# Patient Record
Sex: Female | Born: 1940 | Race: White | Hispanic: No | Smoking: Former smoker
Health system: Southern US, Community
[De-identification: ages and names within clinical notes are randomized; demographics above are authoritative.]

## PROBLEM LIST (undated history)

## (undated) DIAGNOSIS — K859 Acute pancreatitis without necrosis or infection, unspecified: Secondary | ICD-10-CM

## (undated) DIAGNOSIS — Z860100 Personal history of colon polyps, unspecified: Secondary | ICD-10-CM

## (undated) DIAGNOSIS — T7840XA Allergy, unspecified, initial encounter: Secondary | ICD-10-CM

## (undated) DIAGNOSIS — E669 Obesity, unspecified: Secondary | ICD-10-CM

## (undated) DIAGNOSIS — E785 Hyperlipidemia, unspecified: Secondary | ICD-10-CM

## (undated) DIAGNOSIS — K219 Gastro-esophageal reflux disease without esophagitis: Secondary | ICD-10-CM

## (undated) DIAGNOSIS — I10 Essential (primary) hypertension: Secondary | ICD-10-CM

## (undated) HISTORY — DX: Gastro-esophageal reflux disease without esophagitis: K21.9

## (undated) HISTORY — DX: Acute pancreatitis without necrosis or infection, unspecified: K85.90

## (undated) HISTORY — PX: TONSILLECTOMY: SHX5217

## (undated) HISTORY — DX: Hyperlipidemia, unspecified: E78.5

## (undated) HISTORY — DX: Obesity, unspecified: E66.9

## (undated) HISTORY — PX: MOHS SURGERY: SUR867

## (undated) HISTORY — DX: Allergy, unspecified, initial encounter: T78.40XA

## (undated) HISTORY — DX: Essential (primary) hypertension: I10

## (undated) HISTORY — DX: Personal history of colon polyps, unspecified: Z86.0100

## (undated) HISTORY — DX: Personal history of colonic polyps: Z86.010

---

## 1997-10-16 ENCOUNTER — Other Ambulatory Visit: Admission: RE | Admit: 1997-10-16 | Discharge: 1997-10-16 | Payer: Self-pay | Admitting: Obstetrics and Gynecology

## 1998-01-13 ENCOUNTER — Other Ambulatory Visit: Admission: RE | Admit: 1998-01-13 | Discharge: 1998-01-13 | Payer: Self-pay | Admitting: Obstetrics and Gynecology

## 1999-02-16 ENCOUNTER — Other Ambulatory Visit: Admission: RE | Admit: 1999-02-16 | Discharge: 1999-02-16 | Payer: Self-pay | Admitting: Obstetrics and Gynecology

## 2000-04-20 ENCOUNTER — Other Ambulatory Visit: Admission: RE | Admit: 2000-04-20 | Discharge: 2000-04-20 | Payer: Self-pay | Admitting: Obstetrics and Gynecology

## 2002-02-27 ENCOUNTER — Other Ambulatory Visit: Admission: RE | Admit: 2002-02-27 | Discharge: 2002-02-27 | Payer: Self-pay | Admitting: Obstetrics and Gynecology

## 2003-07-09 ENCOUNTER — Other Ambulatory Visit: Admission: RE | Admit: 2003-07-09 | Discharge: 2003-07-09 | Payer: Self-pay | Admitting: Obstetrics and Gynecology

## 2004-12-07 ENCOUNTER — Ambulatory Visit: Payer: Self-pay | Admitting: Internal Medicine

## 2004-12-20 ENCOUNTER — Ambulatory Visit: Payer: Self-pay | Admitting: Internal Medicine

## 2005-01-11 ENCOUNTER — Ambulatory Visit: Payer: Self-pay | Admitting: Family Medicine

## 2005-04-27 ENCOUNTER — Ambulatory Visit: Payer: Self-pay | Admitting: Family Medicine

## 2005-05-03 ENCOUNTER — Ambulatory Visit: Payer: Self-pay | Admitting: Family Medicine

## 2005-05-24 ENCOUNTER — Ambulatory Visit: Payer: Self-pay | Admitting: Family Medicine

## 2005-07-05 ENCOUNTER — Ambulatory Visit: Payer: Self-pay | Admitting: Internal Medicine

## 2005-11-17 ENCOUNTER — Ambulatory Visit: Payer: Self-pay | Admitting: Family Medicine

## 2006-06-15 ENCOUNTER — Ambulatory Visit: Payer: Self-pay | Admitting: Family Medicine

## 2006-12-25 DIAGNOSIS — Z860100 Personal history of colon polyps, unspecified: Secondary | ICD-10-CM | POA: Insufficient documentation

## 2006-12-25 DIAGNOSIS — K219 Gastro-esophageal reflux disease without esophagitis: Secondary | ICD-10-CM | POA: Insufficient documentation

## 2006-12-25 DIAGNOSIS — I10 Essential (primary) hypertension: Secondary | ICD-10-CM | POA: Insufficient documentation

## 2007-02-19 ENCOUNTER — Ambulatory Visit: Payer: Self-pay | Admitting: Family Medicine

## 2007-02-19 DIAGNOSIS — E669 Obesity, unspecified: Secondary | ICD-10-CM | POA: Insufficient documentation

## 2007-05-08 ENCOUNTER — Encounter: Payer: Self-pay | Admitting: Family Medicine

## 2007-05-25 ENCOUNTER — Ambulatory Visit: Payer: Self-pay | Admitting: Family Medicine

## 2007-07-19 ENCOUNTER — Ambulatory Visit: Payer: Self-pay | Admitting: Family Medicine

## 2007-07-19 DIAGNOSIS — J36 Peritonsillar abscess: Secondary | ICD-10-CM | POA: Insufficient documentation

## 2007-10-04 ENCOUNTER — Ambulatory Visit: Payer: Self-pay | Admitting: Family Medicine

## 2007-10-04 DIAGNOSIS — M7660 Achilles tendinitis, unspecified leg: Secondary | ICD-10-CM

## 2007-12-07 DIAGNOSIS — R1011 Right upper quadrant pain: Secondary | ICD-10-CM

## 2007-12-10 ENCOUNTER — Emergency Department (HOSPITAL_COMMUNITY): Admission: EM | Admit: 2007-12-10 | Discharge: 2007-12-11 | Payer: Self-pay | Admitting: Family Medicine

## 2007-12-20 ENCOUNTER — Ambulatory Visit: Payer: Self-pay | Admitting: Family Medicine

## 2007-12-26 ENCOUNTER — Ambulatory Visit (HOSPITAL_COMMUNITY): Admission: RE | Admit: 2007-12-26 | Discharge: 2007-12-26 | Payer: Self-pay | Admitting: Family Medicine

## 2007-12-27 ENCOUNTER — Telehealth (INDEPENDENT_AMBULATORY_CARE_PROVIDER_SITE_OTHER): Payer: Self-pay | Admitting: *Deleted

## 2007-12-27 ENCOUNTER — Telehealth: Payer: Self-pay | Admitting: Family Medicine

## 2007-12-28 ENCOUNTER — Telehealth: Payer: Self-pay | Admitting: Internal Medicine

## 2007-12-31 ENCOUNTER — Ambulatory Visit: Payer: Self-pay | Admitting: Internal Medicine

## 2007-12-31 DIAGNOSIS — R1013 Epigastric pain: Secondary | ICD-10-CM | POA: Insufficient documentation

## 2008-01-07 ENCOUNTER — Ambulatory Visit: Payer: Self-pay | Admitting: Internal Medicine

## 2008-01-07 LAB — CONVERTED CEMR LAB
Amylase: 204 units/L — ABNORMAL HIGH (ref 27–131)
BUN: 21 mg/dL (ref 6–23)
Creatinine, Ser: 1.1 mg/dL (ref 0.4–1.2)
Direct LDL: 153.2 mg/dL
Total CHOL/HDL Ratio: 4.7

## 2008-01-24 ENCOUNTER — Ambulatory Visit: Payer: Self-pay | Admitting: Internal Medicine

## 2008-01-29 ENCOUNTER — Encounter: Payer: Self-pay | Admitting: Family Medicine

## 2008-04-18 ENCOUNTER — Telehealth: Payer: Self-pay | Admitting: Family Medicine

## 2008-04-18 ENCOUNTER — Ambulatory Visit: Payer: Self-pay | Admitting: Family Medicine

## 2008-04-18 DIAGNOSIS — Z8719 Personal history of other diseases of the digestive system: Secondary | ICD-10-CM | POA: Insufficient documentation

## 2008-04-18 LAB — CONVERTED CEMR LAB
CO2: 29 meq/L (ref 19–32)
Direct LDL: 147.3 mg/dL
GFR calc Af Amer: 71 mL/min
Glucose, Bld: 84 mg/dL (ref 70–99)
Potassium: 4.4 meq/L (ref 3.5–5.1)
Sodium: 145 meq/L (ref 135–145)
TSH: 0.08 microintl units/mL — ABNORMAL LOW (ref 0.35–5.50)
Total CHOL/HDL Ratio: 3.9

## 2008-04-29 ENCOUNTER — Ambulatory Visit: Payer: Self-pay | Admitting: Family Medicine

## 2008-04-29 LAB — CONVERTED CEMR LAB
Free T4: 1.2 ng/dL (ref 0.6–1.6)
TSH: 0.07 microintl units/mL — ABNORMAL LOW (ref 0.35–5.50)

## 2008-05-06 ENCOUNTER — Telehealth: Payer: Self-pay | Admitting: Family Medicine

## 2008-06-10 ENCOUNTER — Telehealth: Payer: Self-pay | Admitting: Family Medicine

## 2008-06-10 ENCOUNTER — Ambulatory Visit: Payer: Self-pay | Admitting: Family Medicine

## 2008-06-11 ENCOUNTER — Telehealth: Payer: Self-pay | Admitting: Family Medicine

## 2008-06-12 LAB — CONVERTED CEMR LAB
AST: 28 units/L (ref 0–37)
Albumin: 3.9 g/dL (ref 3.5–5.2)
Alkaline Phosphatase: 72 units/L (ref 39–117)
BUN: 22 mg/dL (ref 6–23)
Bilirubin, Direct: 0.1 mg/dL (ref 0.0–0.3)
Chloride: 102 meq/L (ref 96–112)
Eosinophils Absolute: 0.1 10*3/uL (ref 0.0–0.7)
Eosinophils Relative: 1.1 % (ref 0.0–5.0)
GFR calc Af Amer: 64 mL/min
GFR calc non Af Amer: 52 mL/min
MCV: 92.1 fL (ref 78.0–100.0)
Neutrophils Relative %: 70.1 % (ref 43.0–77.0)
Platelets: 245 10*3/uL (ref 150–400)
Potassium: 3.9 meq/L (ref 3.5–5.1)
RDW: 12.3 % (ref 11.5–14.6)
Sodium: 140 meq/L (ref 135–145)
Total Bilirubin: 0.9 mg/dL (ref 0.3–1.2)
WBC: 9 10*3/uL (ref 4.5–10.5)

## 2008-06-13 ENCOUNTER — Telehealth: Payer: Self-pay | Admitting: Family Medicine

## 2008-07-03 ENCOUNTER — Ambulatory Visit: Payer: Self-pay | Admitting: Family Medicine

## 2008-07-03 DIAGNOSIS — J069 Acute upper respiratory infection, unspecified: Secondary | ICD-10-CM | POA: Insufficient documentation

## 2008-07-07 ENCOUNTER — Telehealth: Payer: Self-pay | Admitting: Family Medicine

## 2008-11-15 IMAGING — CT CT ABDOMEN WO/W CM
3 of 9 series · 13 of 46 positions shown, 19 images · IV contrast (Omnipaque 300)
Comparison: CT abdomen pelvis of 12/11/2007

CLINICAL DATA: Abdominal discomfort, evaluate for pancreatitis

CT ABDOMEN WITHOUT AND WITH CONTRAST
TECHNIQUE: Multidetector CT imaging of the abdomen was performed
following the standard protocol before and during bolus
administration of intravenous contrast.
Contrast: 125 ml Emnipaque-KFF

[Series 2: non_contrast 5.0 b30f st · axial · 0.79mm/px · z∈[-248,-128]mm · 4 of 50 slices shown]
[im 9/50  soft-tissue]
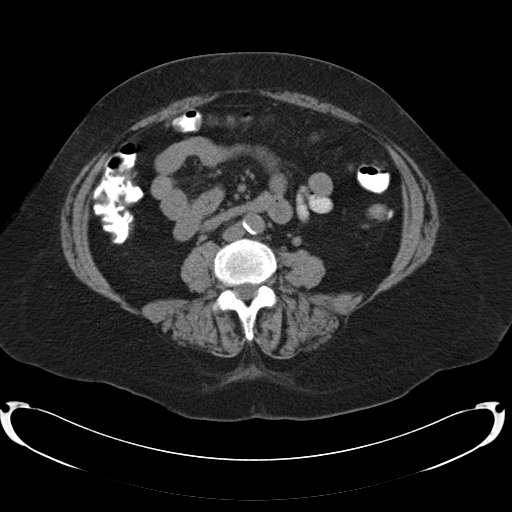
[im 17/50  soft-tissue]
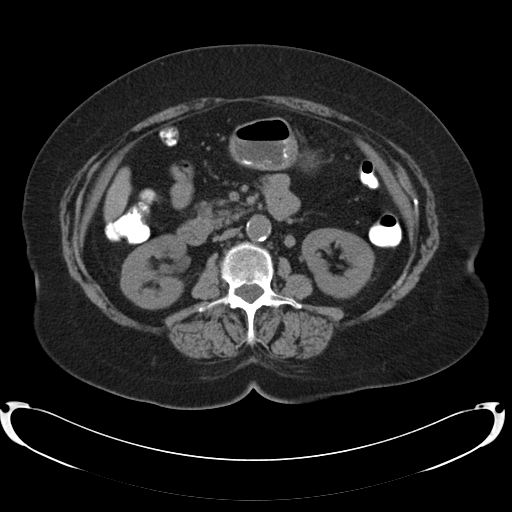
[im 25/50  soft-tissue]
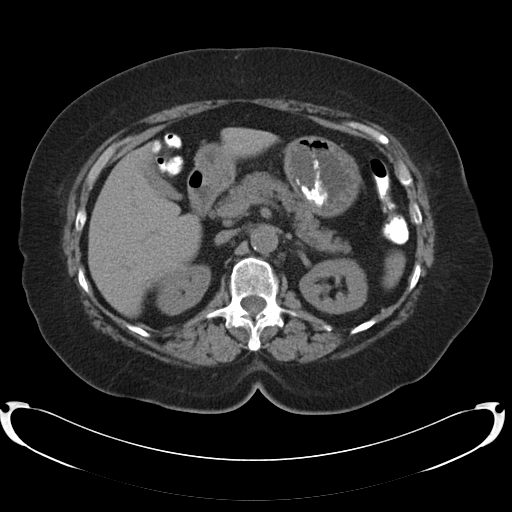
[im 33/50  soft-tissue]
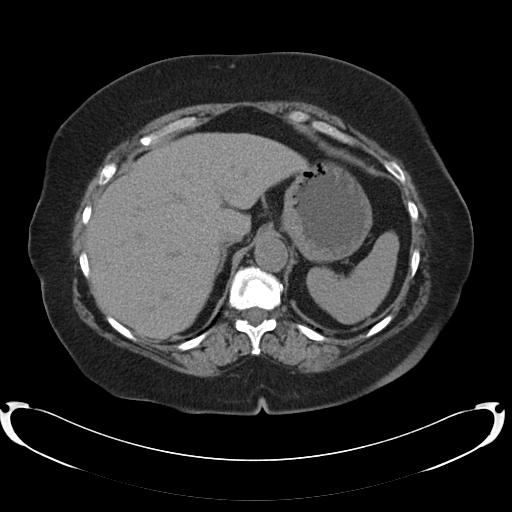

[Series 6: portal_venous 5.0 b30f · axial · 0.80mm/px · z∈[-255,-80]mm · 6 of 49 slices shown, 11 images]
[im 7/49  soft-tissue]
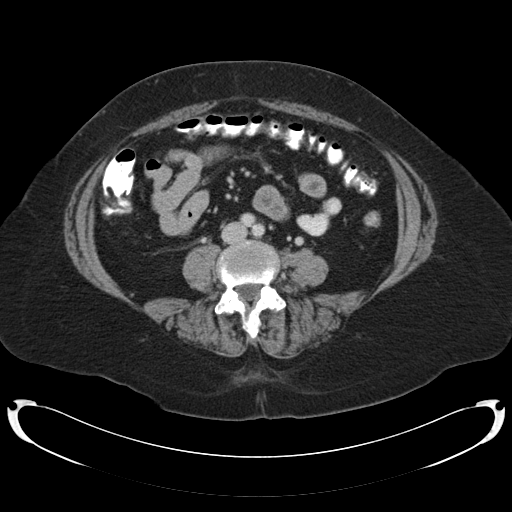
[im 7/49  bone]
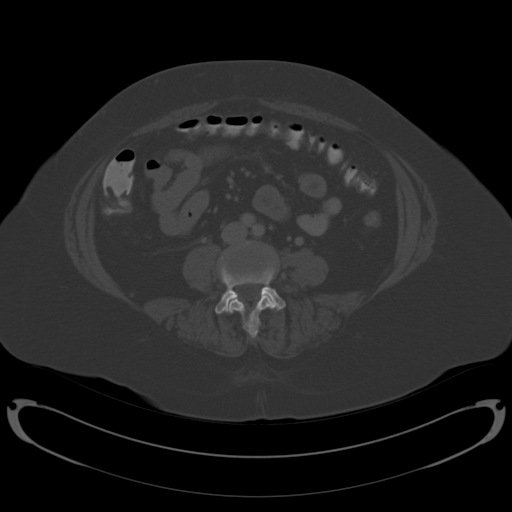
[im 14/49  soft-tissue]
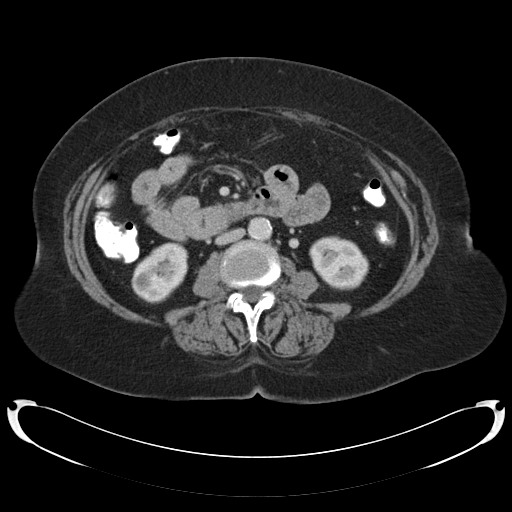
[im 21/49  soft-tissue]
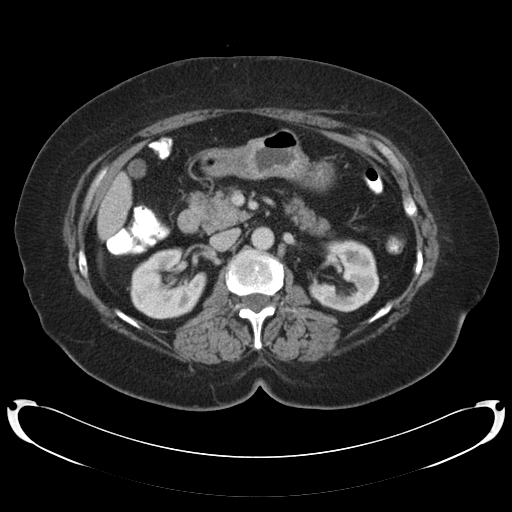
[im 21/49  lung]
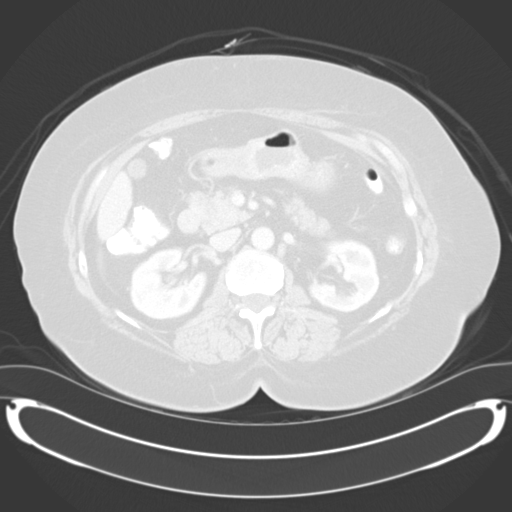
[im 28/49  soft-tissue]
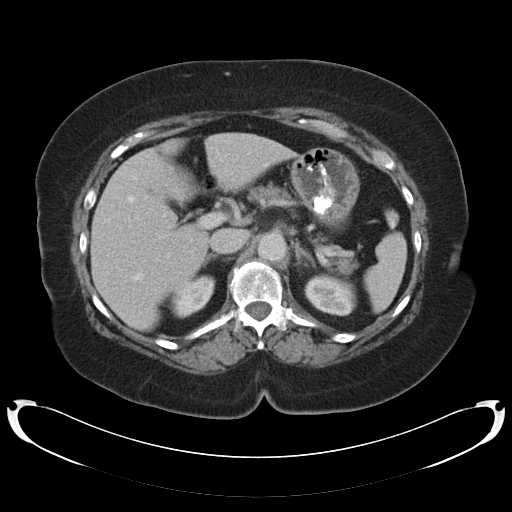
[im 28/49  lung]
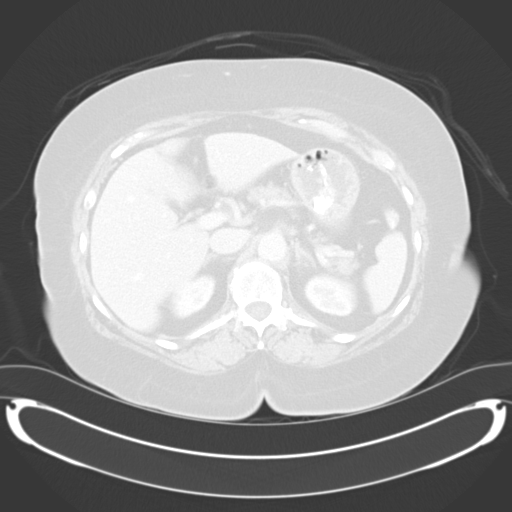
[im 35/49  soft-tissue]
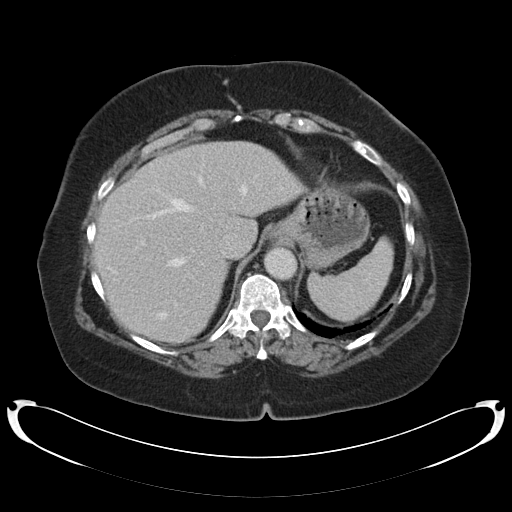
[im 35/49  lung]
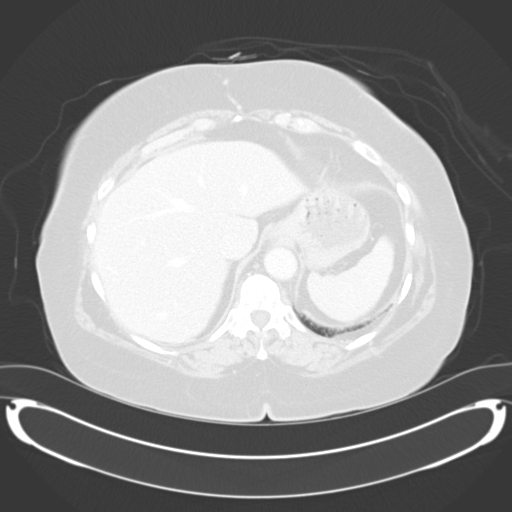
[im 42/49  soft-tissue]
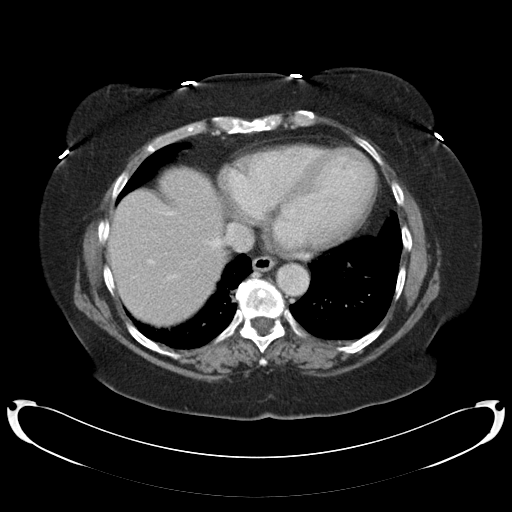
[im 42/49  lung]
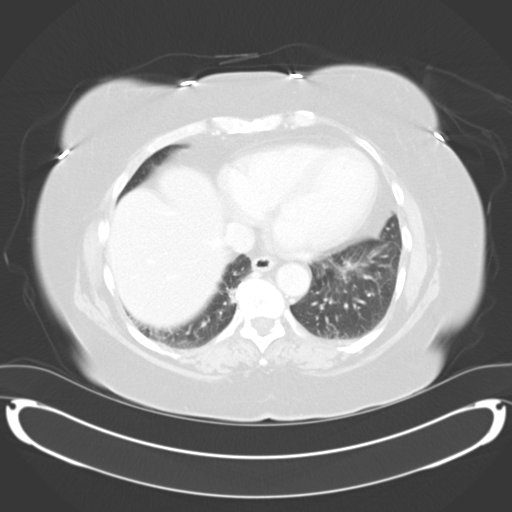

[Series 602: <mpr thick range> · coronal · 0.80mm/px · 3 of 83 slices shown, 4 images]
[im 21/83  soft-tissue]
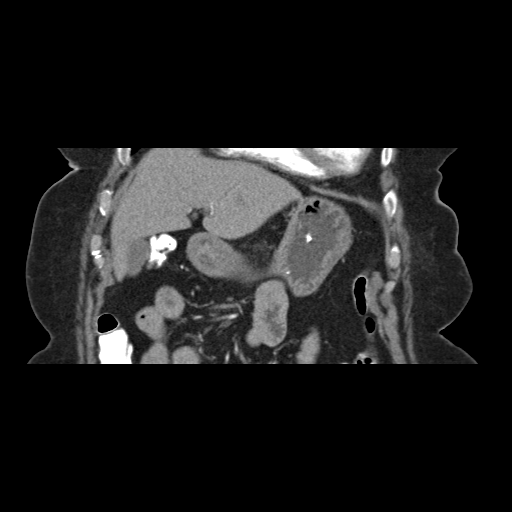
[im 42/83  soft-tissue]
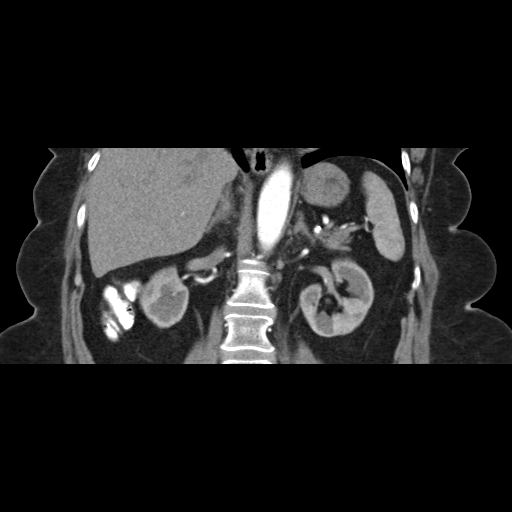
[im 42/83  bone]
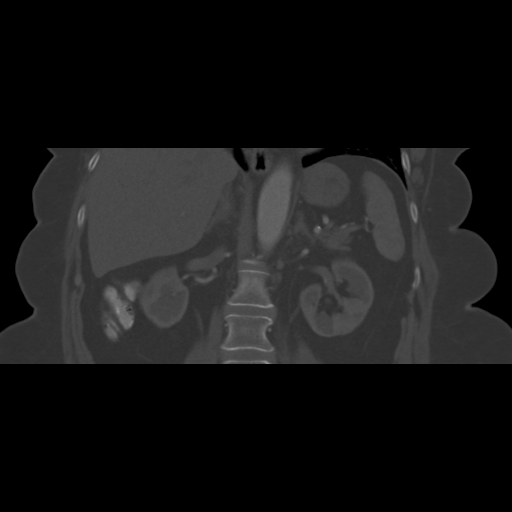
[im 62/83  soft-tissue]
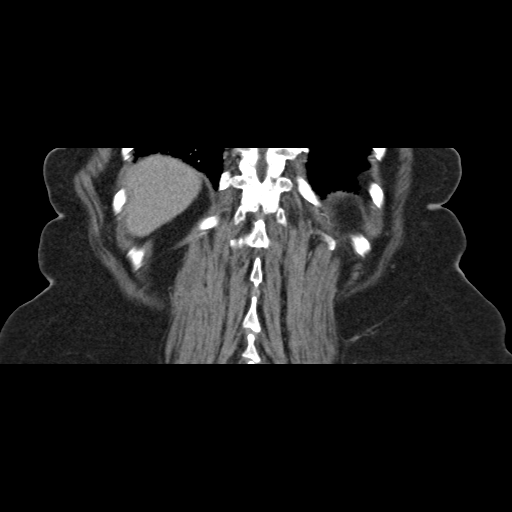

[13 of 46 positions shown; findings below may reference images not displayed]

FINDINGS: On the unenhanced study, no renal calculi are seen.  No
calcified gallstones are noted.

After contrast administration the pancreas appears more normal.
The previously described peripancreatic inflammatory change has
resolved consistent with prior pancreatitis with interval
resolution.  The parenchyma of the  pancreas enhances normally and
the pancreatic duct is not dilated.  The peripancreatic fat planes
are well preserved.

The remainder of he study shows the liver to enhance with no focal
abnormality and no ductal dilatation is seen.  The adrenal glands
and spleen are stable.  The kidneys enhance and the pelvocaliceal
systems appear normal.  A small cyst is noted in the lower pole of
the right kidney.  The abdominal aorta is normal in caliber with
mild atheromatous change noted.
IMPRESSION: The previously noted changes of pancreatitis of the head of the
pancreas have resolved.  No pancreatic ductal dilatation is seen.
No acute process is noted.

## 2009-01-26 ENCOUNTER — Emergency Department (HOSPITAL_COMMUNITY): Admission: EM | Admit: 2009-01-26 | Discharge: 2009-01-26 | Payer: Self-pay | Admitting: Family Medicine

## 2009-03-04 ENCOUNTER — Ambulatory Visit: Payer: Self-pay | Admitting: Family Medicine

## 2009-03-04 DIAGNOSIS — R930 Abnormal findings on diagnostic imaging of skull and head, not elsewhere classified: Secondary | ICD-10-CM | POA: Insufficient documentation

## 2009-03-09 ENCOUNTER — Encounter: Payer: Self-pay | Admitting: Family Medicine

## 2009-03-09 ENCOUNTER — Ambulatory Visit: Payer: Self-pay | Admitting: Cardiology

## 2009-03-09 ENCOUNTER — Ambulatory Visit: Payer: Self-pay

## 2009-03-09 ENCOUNTER — Ambulatory Visit (HOSPITAL_COMMUNITY): Admission: RE | Admit: 2009-03-09 | Discharge: 2009-03-09 | Payer: Self-pay | Admitting: Family Medicine

## 2009-03-12 ENCOUNTER — Encounter: Payer: Self-pay | Admitting: *Deleted

## 2009-03-16 ENCOUNTER — Ambulatory Visit: Payer: Self-pay | Admitting: Family Medicine

## 2009-04-23 ENCOUNTER — Ambulatory Visit: Payer: Self-pay | Admitting: Family Medicine

## 2009-04-23 LAB — CONVERTED CEMR LAB
Bilirubin Urine: NEGATIVE
Blood in Urine, dipstick: NEGATIVE
Glucose, Urine, Semiquant: NEGATIVE
Ketones, urine, test strip: NEGATIVE
Urobilinogen, UA: 0.2
pH: 6

## 2009-04-28 LAB — CONVERTED CEMR LAB
Alkaline Phosphatase: 67 units/L (ref 39–117)
BUN: 25 mg/dL — ABNORMAL HIGH (ref 6–23)
Basophils Relative: 1.1 % (ref 0.0–3.0)
Bilirubin, Direct: 0.1 mg/dL (ref 0.0–0.3)
Calcium: 9.6 mg/dL (ref 8.4–10.5)
Chloride: 106 meq/L (ref 96–112)
Cholesterol: 220 mg/dL — ABNORMAL HIGH (ref 0–200)
Creatinine, Ser: 1.1 mg/dL (ref 0.4–1.2)
Direct LDL: 162.1 mg/dL
Eosinophils Absolute: 0.2 10*3/uL (ref 0.0–0.7)
Eosinophils Relative: 3.1 % (ref 0.0–5.0)
HDL: 54.2 mg/dL (ref >39.00)
Lymphocytes Relative: 24.9 % (ref 12.0–46.0)
MCHC: 32.7 g/dL (ref 30.0–36.0)
MCV: 94 fL (ref 78.0–100.0)
Monocytes Absolute: 0.5 10*3/uL (ref 0.1–1.0)
Neutrophils Relative %: 61.7 % (ref 43.0–77.0)
Platelets: 255 10*3/uL (ref 150.0–400.0)
RBC: 3.85 M/uL — ABNORMAL LOW (ref 3.87–5.11)
Total Bilirubin: 0.9 mg/dL (ref 0.3–1.2)
Total CHOL/HDL Ratio: 4
Triglycerides: 79 mg/dL (ref 0.0–149.0)
VLDL: 15.8 mg/dL (ref 0.0–40.0)
WBC: 5.5 10*3/uL (ref 4.5–10.5)

## 2009-05-04 ENCOUNTER — Ambulatory Visit: Payer: Self-pay | Admitting: Family Medicine

## 2009-05-04 DIAGNOSIS — R718 Other abnormality of red blood cells: Secondary | ICD-10-CM | POA: Insufficient documentation

## 2009-05-04 LAB — CONVERTED CEMR LAB: T3, Free: 2.7 pg/mL (ref 2.3–4.2)

## 2009-05-29 ENCOUNTER — Ambulatory Visit: Payer: Self-pay | Admitting: Family Medicine

## 2009-12-10 ENCOUNTER — Encounter: Payer: Self-pay | Admitting: Family Medicine

## 2009-12-23 ENCOUNTER — Encounter (HOSPITAL_COMMUNITY): Admission: RE | Admit: 2009-12-23 | Discharge: 2010-03-05 | Payer: Self-pay | Admitting: Family Medicine

## 2009-12-29 ENCOUNTER — Ambulatory Visit: Payer: Self-pay | Admitting: Family Medicine

## 2009-12-29 DIAGNOSIS — E042 Nontoxic multinodular goiter: Secondary | ICD-10-CM

## 2009-12-30 ENCOUNTER — Telehealth: Payer: Self-pay | Admitting: Family Medicine

## 2009-12-30 LAB — CONVERTED CEMR LAB
Free T4: 2.38 ng/dL — ABNORMAL HIGH (ref 0.60–1.60)
T3, Free: 3.6 pg/mL (ref 2.3–4.2)
TSH: 0.01 microintl units/mL — ABNORMAL LOW (ref 0.35–5.50)

## 2010-01-04 ENCOUNTER — Ambulatory Visit: Payer: Self-pay | Admitting: Family Medicine

## 2010-01-25 ENCOUNTER — Telehealth: Payer: Self-pay | Admitting: Family Medicine

## 2010-02-04 ENCOUNTER — Ambulatory Visit: Payer: Self-pay | Admitting: Family Medicine

## 2010-02-08 ENCOUNTER — Telehealth: Payer: Self-pay | Admitting: Family Medicine

## 2010-04-07 ENCOUNTER — Encounter: Payer: Self-pay | Admitting: Family Medicine

## 2010-04-25 ENCOUNTER — Encounter: Payer: Self-pay | Admitting: Family Medicine

## 2010-04-27 ENCOUNTER — Other Ambulatory Visit: Payer: Self-pay | Admitting: Family Medicine

## 2010-04-27 ENCOUNTER — Encounter: Payer: Self-pay | Admitting: Family Medicine

## 2010-04-27 ENCOUNTER — Ambulatory Visit
Admission: RE | Admit: 2010-04-27 | Discharge: 2010-04-27 | Payer: Self-pay | Source: Home / Self Care | Attending: Family Medicine | Admitting: Family Medicine

## 2010-04-27 ENCOUNTER — Other Ambulatory Visit
Admission: RE | Admit: 2010-04-27 | Discharge: 2010-04-27 | Payer: Self-pay | Source: Home / Self Care | Admitting: Family Medicine

## 2010-04-27 DIAGNOSIS — N393 Stress incontinence (female) (male): Secondary | ICD-10-CM | POA: Insufficient documentation

## 2010-04-27 LAB — HEPATIC FUNCTION PANEL
ALT: 22 U/L (ref 0–35)
AST: 28 U/L (ref 0–37)
Albumin: 4 g/dL (ref 3.5–5.2)
Total Protein: 7.1 g/dL (ref 6.0–8.3)

## 2010-04-27 LAB — CONVERTED CEMR LAB
Bilirubin Urine: NEGATIVE
Glucose, Urine, Semiquant: NEGATIVE
Ketones, urine, test strip: NEGATIVE
Nitrite: NEGATIVE
Protein, U semiquant: NEGATIVE
Urobilinogen, UA: 0.2

## 2010-04-27 LAB — CBC WITH DIFFERENTIAL/PLATELET
Basophils Relative: 0.7 % (ref 0.0–3.0)
Eosinophils Relative: 1.8 % (ref 0.0–5.0)
HCT: 36.5 % (ref 36.0–46.0)
Hemoglobin: 12.4 g/dL (ref 12.0–15.0)
Lymphs Abs: 1.8 10*3/uL (ref 0.7–4.0)
Monocytes Relative: 7.2 % (ref 3.0–12.0)
Neutro Abs: 3.7 10*3/uL (ref 1.4–7.7)
WBC: 6.1 10*3/uL (ref 4.5–10.5)

## 2010-04-27 LAB — BASIC METABOLIC PANEL
Chloride: 102 mEq/L (ref 96–112)
GFR: 47.13 mL/min — ABNORMAL LOW (ref >60.00)
Glucose, Bld: 96 mg/dL (ref 70–99)
Potassium: 4.5 mEq/L (ref 3.5–5.1)
Sodium: 141 mEq/L (ref 135–145)

## 2010-04-27 LAB — TSH: TSH: 0.84 u[IU]/mL (ref 0.35–5.50)

## 2010-04-27 LAB — LIPID PANEL
HDL: 77.7 mg/dL (ref >39.00)
VLDL: 13.6 mg/dL (ref 0.0–40.0)

## 2010-05-02 LAB — CONVERTED CEMR LAB
ALT: 22 units/L (ref 0–35)
AST: 25 units/L (ref 0–37)
Alkaline Phosphatase: 46 units/L (ref 39–117)
BUN: 27 mg/dL — ABNORMAL HIGH (ref 6–23)
Basophils Relative: 1 % (ref 0.0–1.0)
Blood in Urine, dipstick: NEGATIVE
CO2: 29 meq/L (ref 19–32)
Calcium: 10.2 mg/dL (ref 8.4–10.5)
Chloride: 105 meq/L (ref 96–112)
Creatinine, Ser: 1.2 mg/dL (ref 0.4–1.2)
Glucose, Urine, Semiquant: NEGATIVE
HDL: 51.6 mg/dL (ref >39.0)
Hemoglobin: 12.2 g/dL (ref 12.0–15.0)
Ketones, urine, test strip: NEGATIVE
LDL Cholesterol: 102 mg/dL — ABNORMAL HIGH (ref 0–99)
Monocytes Absolute: 0.6 10*3/uL (ref 0.2–0.7)
Monocytes Relative: 8 % (ref 3.0–11.0)
Potassium: 5.1 meq/L (ref 3.5–5.1)
RDW: 12.1 % (ref 11.5–14.6)
Specific Gravity, Urine: 1.025
Total Bilirubin: 0.9 mg/dL (ref 0.3–1.2)
Total Protein: 6.9 g/dL (ref 6.0–8.3)
VLDL: 17 mg/dL (ref 0–40)

## 2010-05-03 ENCOUNTER — Encounter: Payer: Self-pay | Admitting: Family Medicine

## 2010-05-04 NOTE — Progress Notes (Signed)
Summary: thyroid results  Phone Note Call from Patient   Caller: Patient Summary of Call: Pt is asking for thyroid results, and what to do next?    Initial call taken by: Lynann Beaver CMA AAMA,  January 25, 2010 4:37 PM  Follow-up for Phone Call        she needs an office visit this week to assess how she is doing and to get a blood test at the same time Follow-up by: Roderick Pee MD,  January 26, 2010 8:30 AM  Additional Follow-up for Phone Call Additional follow up Details #1::        left message on machine for patient to schedule an appointment Additional Follow-up by: Kern Reap CMA Duncan Dull),  January 26, 2010 2:14 PM

## 2010-05-04 NOTE — Assessment & Plan Note (Signed)
Summary: FUP ON TSH//CCM   Vital Signs:  Patient profile:   70 year old female BP sitting:   114 / 80  (left arm) Cuff size:   regular  Vitals Entered By: Kern Reap CMA Duncan Dull) (February 04, 2010 2:38 PM) CC: follow-up visit   Primary Care Harriette Tovey:  Governor Specking, MD  CC:  follow-up visit.  History of Present Illness: Kim Fletcher is a 70 year old female, who comes in today for follow-up of hyperthyroidism.  She has a multinodular goiter and on scan had a very active.  Hot spot.  She was treated with RAI on October 6 and comes back today for follow-up.  Asymptomatic  Allergies: No Known Drug Allergies  Review of Systems      See HPI  Physical Exam  General:  Well-developed,well-nourished,in no acute distress; alert,appropriate and cooperative throughout examination Neck:  thyroid is symmetrically enlarged multinodular goiters as noted previously   Impression & Recommendations:  Problem # 1:  GOITER, MULTINODULAR (ICD-241.1) Assessment Improved  Orders: Venipuncture (38756) TLB-TSH (Thyroid Stimulating Hormone) (84443-TSH) TLB-T4 (Thyrox), Free [phone]) TLB-T3, Free (Triiodothyronine) (84481-T3FREE)  Complete Medication List: 1)  Hydrochlorothiazide 25 Mg Tabs (Hydrochlorothiazide) .... Take 1 tablet by mouth every morning  Patient Instructions: 1)  I will call you and I get your lab work back   Orders Added: 1)  Est. Patient Level III [41660] 2)  Venipuncture [36415] 3)  TLB-TSH (Thyroid Stimulating Hormone) [84443-TSH] 4)  TLB-T4 (Thyrox), Free [63016-WF0X] 5)  TLB-T3, Free (Triiodothyronine) [32355-D3UKGU]  Appended Document: Orders Update     Clinical Lists Changes  Orders: Added new Service order of Specimen Handling (54270) - Signed

## 2010-05-04 NOTE — Miscellaneous (Signed)
Summary: Orders Update  Clinical Lists Changes  Orders: Added new Referral order of Misc. Referral (Misc. Ref) - Signed 

## 2010-05-04 NOTE — Assessment & Plan Note (Signed)
Summary: fu on tsh scan/njr   Vital Signs:  Patient profile:   70 year old female BP sitting:   124 / 80  (right arm) Cuff size:   regular  Vitals Entered By: Kern Reap CMA Duncan Dull) (December 29, 2009 9:24 AM)  Contraindications/Deferment of Procedures/Staging:    Test/Procedure: Weight Refused    Reason for deferment: patient declined-cannot calculate BMI   Primary Care Provider:  Governor Specking, MD   History of Present Illness: Kim Fletcher is a 69 year old female, who comes in today for follow-up of a multinodular goiter.  We saw her last January for annual physical examination and noted a goiter on physical examination.  Her thyroid output.  T 3, was 2.7 and her T4 was 1.3.  I recommended that time she get a thyroid scan and uptake determined.  The etiology of her problem.  She states she got dizzy and didn't get her scan until September, the 21st.  Her skin shows a normal uptake 19% over 24 hours a multinodular goiter without Thomas, hot nodule on the left inferior pole.  She remains asymptomatic  Allergies: No Known Drug Allergies  Past History:  Past medical, surgical, family and social histories (including risk factors) reviewed for relevance to current acute and chronic problems.  Past Medical History: Reviewed history from 04/18/2008 and no changes required. Obese High Cholesterol Hypertension Colonic polyps, hx of GERD Allergies Pancreatitis, hx of  09 statin ???  Past Surgical History: Reviewed history from 12/25/2006 and no changes required. Tonsillectomy  Family History: Reviewed history from 12/31/2007 and no changes required. Family History Lung cancer Family History of Cardiovascular disorder:  Mother  Social History: Reviewed history from 12/31/2007 and no changes required.  Single Alcohol use-yes-4-5 drinks of wine weekly Regular exercise-yes Occupation: [employer] Patient is a former smoker. -stopped 4 years ago  Review of  Systems      See HPI       Flu Vaccine Consent Questions     Do you have a history of severe allergic reactions to this vaccine? no    Any prior history of allergic reactions to egg and/or gelatin? no    Do you have a sensitivity to the preservative Thimersol? no    Do you have a past history of Guillan-Barre Syndrome? no    Do you currently have an acute febrile illness? no    Have you ever had a severe reaction to latex? no    Vaccine information given and explained to patient? yes    Are you currently pregnant? no    Lot Number:[id number]   Exp Date:10/02/2010   Site Given  Left Deltoid IM   Physical Exam  General:  Well-developed,well-nourished,in no acute distress; alert,appropriate and cooperative throughout examination Neck:  both lobes of the thyroid gland showed multiple nodules.  There is also a palpable nodule left inferior pole, consistent with we see on the scan and uptake.   Impression & Recommendations:  Problem # 1:  GOITER, MULTINODULAR (ICD-241.1) Assessment Unchanged  Orders: Venipuncture (16109) Specimen Handling (60454) TLB-TSH (Thyroid Stimulating Hormone) (84443-TSH) TLB-T4 (Thyrox), Free [phone]) TLB-T3, Free (Triiodothyronine) (84481-T3FREE)  Complete Medication List: 1)  Hydrochlorothiazide 25 Mg Tabs (Hydrochlorothiazide) .... Take 1 tablet by mouth every morning  Other Orders: Flu Vaccine 59yrs + MEDICARE PATIENTS (W2956) Administration Flu vaccine - MCR (O1308)  Patient Instructions: 1)  I will call you when I get the report from your lab work. 2)  Seven appointment in January for your annual exam

## 2010-05-04 NOTE — Assessment & Plan Note (Signed)
Summary: cough/ok per doc/njr 4.45pm   Vital Signs:  Patient profile:   70 year old female Temp:     97.6 degrees F oral BP sitting:   130 / 70  (left arm) Cuff size:   regular  Vitals Entered By: Sid Falcon LPN (May 29, 2009 4:50 PM) CC: coug, plugged ears   History of Present Illness: Acute visit. Onset of upper respiratory illness over 2 weeks ago. Persistent productive cough for the past couple of weeks. Ongoing fatigue. No fever. No history of smoking. Denies any dyspnea, hemoptysis, or pleuritic pain. Intermittent bilateral ear pressure. Has taken a couple of over-the-counter cough medications and leftover Tessalon without improvement.  Allergies (verified): No Known Drug Allergies  Past History:  Past Medical History: Last updated: 04/18/2008 Obese High Cholesterol Hypertension Colonic polyps, hx of GERD Allergies Pancreatitis, hx of  09 statin ???  Social History: Last updated: 12/31/2007  Single Alcohol use-yes-4-5 drinks of wine weekly Regular exercise-yes Occupation: [employer] Patient is a former smoker. -stopped 4 years ago PMH reviewed for relevance, PSH reviewed for relevance  Review of Systems      See HPI  Physical Exam  General:  Well-developed,well-nourished,in no acute distress; alert,appropriate and cooperative throughout examination Eyes:  pupils equal, pupils round, and pupils reactive to light.   Ears:  External ear exam shows no significant lesions or deformities.  Otoscopic examination reveals clear canals, tympanic membranes are intact bilaterally without bulging, retraction, inflammation or discharge. Hearing is grossly normal bilaterally. Nose:  External nasal examination shows no deformity or inflammation. Nasal mucosa are pink and moist without lesions or exudates. Mouth:  Oral mucosa and oropharynx without lesions or exudates.  Teeth in good repair. Neck:  No deformities, masses, or tenderness noted. Lungs:   Normal respiratory effort, chest expands symmetrically. Lungs are clear to auscultation, no crackles or wheezes. Heart:  normal rate and regular rhythm.     Impression & Recommendations:  Problem # 1:  COUGH (ICD-786.2) possibly post viral bronchitis. Given persistent productive cough we'll treat with antibiotics and cough medication for suppression at night  Complete Medication List: 1)  Hydrochlorothiazide 25 Mg Tabs (Hydrochlorothiazide) .... Take 1 tablet by mouth every morning 2)  Hydrocodone-homatropine 5-1.5 Mg/48ml Syrp (Hydrocodone-homatropine) .... One tsp by mouth q 4-6 hours as needed cough 3)  Azithromycin 250 Mg Tabs (Azithromycin) .... 2 by mouth today then one by mouth once daily for 4 days  Patient Instructions: 1)  Acute Bronchitis symptoms for less then 10 days are not  helped by antibiotics. Take over the counter cough medications. Call if no improvement in 5-7 days, sooner if increasing cough, fever, or new symptoms ( shortness of breath, chest pain) .  Prescriptions: AZITHROMYCIN 250 MG TABS (AZITHROMYCIN) 2 by mouth today then one by mouth once daily for 4 days  #6 x 0   Entered and Authorized by:   Evelena Peat MD   Signed by:   Evelena Peat MD on 05/29/2009   Method used:   Print then Give to Patient   RxID:   [id number] HYDROCODONE-HOMATROPINE 5-1.5 MG/5ML SYRP (HYDROCODONE-HOMATROPINE) one tsp by mouth q 4-6 hours as needed cough  #120 ml x 0   Entered and Authorized by:   Evelena Peat MD   Signed by:   Evelena Peat MD on 05/29/2009   Method used:   Print then Give to Patient   RxID:   [id number]

## 2010-05-04 NOTE — Assessment & Plan Note (Signed)
Summary: follow up labs - rv   Reason for Visit follow up lab results  Primary Care Provider:  Governor Specking, MD   History of Present Illness: Kim Fletcher is a 70 year old female, who comes in today for 3 abnormal labs and discuss the treatment options of them.  Her lipids are abnormal with a total cholesterol of 220 and then LDL 162.  HDL 54, triglycerides 79.  She goes to the [employer] to 4 days per week.  However, her diet is high, unsaturated fats.  We discussed various options.  She will modify her diet will get a follow-up lipid panel in 3 months.  She has been taking aspirin daily.  Hemoglobin last year was 13 pounds, 11.8.  She colonoscopy in 2008, which was normal.  She's noticed no change in her house.  Bowel habits, rectal bleeding, etc..  The rest of her CBC was normal.  Her TSH level is .09.  She saw an endocrinologist in the past and was borderline........... she doesn't recall if it was hypo-or hyper thyroidism  Allergies: No Known Drug Allergies  Past History:  Past medical, surgical, family and social histories (including risk factors) reviewed for relevance to current acute and chronic problems.  Past Medical History: Reviewed history from 04/18/2008 and no changes required. Obese High Cholesterol Hypertension Colonic polyps, hx of GERD Allergies Pancreatitis, hx of  09 statin ???  Past Surgical History: Reviewed history from 12/25/2006 and no changes required. Tonsillectomy  Family History: Reviewed history from 12/31/2007 and no changes required. Family History Lung cancer Family History of Cardiovascular disorder:  Mother  Social History: Reviewed history from 12/31/2007 and no changes required.  Single Alcohol use-yes-4-5 drinks of wine weekly Regular exercise-yes Occupation: [employer] Patient is a former smoker. 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 Take 1 tablet by mouth every morning  Patient Instructions: 1)  continue your exercise program to decrease your fat intake.  Follow-up lipid panel in 3 months could number 272.0. 2)  Stop all aspirin and aspirin products.  Take iron one tablet at bedtime x 3 months.  Follow-up CBC in 3 months could number 278.0.  3)  We will get some special thyroid blood test today and  set up for an scan and uptake of the thyroid glandreturn in 3 to 4 days after your thyroid scan for follow-up

## 2010-05-04 NOTE — Progress Notes (Signed)
Summary: please return call  Phone Note Call from Patient Call back at [phone]   Caller: Patient---live call Reason for Call: Lab or Test Results Summary of Call: please return her call. Initial call taken by: Warnell Forester,  December 30, 2009 4:01 PM  Follow-up for Phone Call        Phone Call Completed Follow-up by: Kern Reap CMA Duncan Dull),  December 31, 2009 10:13 AM

## 2010-05-04 NOTE — Assessment & Plan Note (Signed)
Summary: fup per dr//ccm   Vital Signs:  Patient profile:   70 year old female Temp:     97.7 degrees F oral BP sitting:   120 / 80  (left arm)  Vitals Entered By: Kern Reap CMA Duncan Dull) (January 04, 2010 4:19 PM) CC: follow-up visit   Primary Care Laramie Gelles:  Governor Specking, MD  CC:  follow-up visit.  History of Present Illness: Saina is a 70 year old female, who comes in today for follow-up of a multinodular goiter.  Thyroid scan showed abnormal uptake of 19%.  However, she has a, nodule that's hyperactive.  It that point in time, her laboratory data was normal.  Now.  Her TSH is .01, her T4 is elevated 2.38, and her T3 is 3.6.  We discussed various options, including watchful waiting, oral medications, and radioactive iodine.  The patient elected radioactive iodine  Allergies: No Known Drug Allergies  Past History:  Past medical, surgical, family and social histories (including risk factors) reviewed for relevance to current acute and chronic problems.  Past Medical History: Reviewed history from 04/18/2008 and no changes required. Obese High Cholesterol Hypertension Colonic polyps, hx of GERD Allergies Pancreatitis, hx of  09 statin ???  Past Surgical History: Reviewed history from 12/25/2006 and no changes required. Tonsillectomy  Family History: Reviewed history from 12/31/2007 and no changes required. Family History Lung cancer Family History of Cardiovascular disorder:  Mother  Social History: Reviewed history from 12/31/2007 and no changes required.  Single Alcohol use-yes-4-5 drinks of wine weekly Regular exercise-yes Occupation: [employer] Patient is a former smoker. -stopped 4 years ago  Physical Exam  General:  Well-developed,well-nourished,in no acute distress; alert,appropriate and cooperative throughout examination Neck:  enlarged gland, mole.  Two nodular palpation   Impression & Recommendations:  Problem # 1:  GOITER,  MULTINODULAR (ICD-241.1) Assessment Deteriorated  Orders: Radiology Referral (Radiology)  Problem # 2:  HYPERTHYROIDISM (ICD-242.90) Assessment: Deteriorated  Orders: Radiology Referral (Radiology)  Complete Medication List: 1)  Hydrochlorothiazide 25 Mg Tabs (Hydrochlorothiazide) .... Take 1 tablet by mouth every morning  Patient Instructions: 1)  we will call today to set up for the RAI

## 2010-05-04 NOTE — Progress Notes (Signed)
Summary: Pt returned call. Has questions re:lab results,meds and hormones  Phone Note Call from Patient Call back at [facility] Phone [phone] Call back at [phone] cell   Caller: Patient Summary of Call: Pt returned call. Pls call back re: lab results. Pt has questions re: meds and hormones. Pls call.   Initial call taken by: Lucy Antigua,  February 08, 2010 8:31 AM  Follow-up for Phone Call        spoke with patient  Follow-up by: Kern Reap CMA Duncan Dull),  February 08, 2010 12:02 PM

## 2010-05-04 NOTE — Assessment & Plan Note (Signed)
Summary: emp-will fast//ccm   Vital Signs:  Patient profile:   70 year old female Height:      61.5 inches Weight:      198 pounds Temp:     97.8 degrees F oral BP sitting:   116 / 80  (left arm) Cuff size:   regular  Vitals Entered By: Kern Reap CMA Duncan Dull) (April 23, 2009 8:27 AM)  Reason for Visit follow up bp meds  Primary Care Provider:  Governor Specking, MD   History of Present Illness: Kim Fletcher is a 70 year old female, nonsmoker, who comes in today for evaluation.  She has a history of underlying hypertension, for which he had a course is a 25 mg daily.  BP 116/80.  She has slight skin and light eyes chronic sun damage.  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  She had a skin graft to close the lesion.  She gets routine eye care.  Dental care does not do BSE monthly, but does and no mammography.  Colonoscopy normal 2005, tetanus, 2004, seasonal flu 2010, Pneumovax 2007, shingles 2009.  She was encouraged to take a baby aspirin, calcium, and vitamin D on a daily basis.  She had a bone density two years ago, which was normal.  she has transitioned to Medicare.  She has a policy that pays for her [employer] membership.  She goes 4 days a week now  Allergies (verified): No Known Drug Allergies  Past History:  Past medical, surgical, family and social histories (including risk factors) reviewed, and no changes noted (except as noted below).  Past Medical History: Reviewed history from 04/18/2008 and no changes required. Obese High Cholesterol Hypertension Colonic polyps, hx of GERD Allergies Pancreatitis, hx of  09 statin ???  Past Surgical History: Reviewed history from 12/25/2006 and no changes required. Tonsillectomy  Family History: Reviewed history from 12/31/2007 and no changes required. Family History Lung cancer Family History of Cardiovascular disorder:  Mother  Social History: Reviewed history from  12/31/2007 and no changes required.  Single Alcohol use-yes-4-5 drinks of wine weekly Regular exercise-yes Occupation: [employer] Patient is a former smoker. -stopped 4 years ago  Review of Systems      See HPI  Physical Exam  General:  Well-developed,well-nourished,in no acute distress; alert,appropriate and cooperative throughout examination Head:  Normocephalic and atraumatic without obvious abnormalities. No apparent alopecia or balding. Eyes:  No corneal or conjunctival inflammation noted. EOMI. Perrla. Funduscopic exam benign, without hemorrhages, exudates or papilledema. Vision grossly normal. Ears:  External ear exam shows no significant lesions or deformities.  Otoscopic examination reveals clear canals, tympanic membranes are intact bilaterally without bulging, retraction, inflammation or discharge. Hearing is grossly normal bilaterally. Nose:  External nasal examination shows no deformity or inflammation. Nasal mucosa are pink and moist without lesions or exudates. Mouth:  Oral mucosa and oropharynx without lesions or exudates.  Teeth in good repair. Neck:  No deformities, masses, or tenderness noted. Chest Wall:  No deformities, masses, or tenderness noted. Breasts:  No mass, nodules, thickening, tenderness, bulging, retraction, inflamation, nipple discharge or skin changes noted.  there is a Watling spot on her right nipple at the 9 o'clock position.  It was removed nothing underneath.  She thinks it may have been some precipitation of skin lotion Lungs:  Normal respiratory effort, chest expands symmetrically. Lungs are clear to auscultation, no crackles or wheezes. Heart:  Normal rate and regular rhythm. S1 and  S2 normal without gallop, murmur, click, rub or other extra sounds. Abdomen:  Bowel sounds positive,abdomen soft and non-tender without masses, organomegaly or hernias noted. Msk:  No deformity or scoliosis noted of thoracic or lumbar spine.   Pulses:  R  and L carotid,radial,femoral,dorsalis pedis and posterior tibial pulses are full and equal bilaterally Extremities:  No clubbing, cyanosis, edema, or deformity noted with normal full range of motion of all joints.   Neurologic:  No cranial nerve deficits noted. Station and gait are normal. Plantar reflexes are down-going bilaterally. DTRs are symmetrical throughout. Sensory, motor and coordinative functions appear intact. Skin:  Intact without suspicious lesions or rashes Cervical Nodes:  No lymphadenopathy noted Axillary Nodes:  No palpable lymphadenopathy Inguinal Nodes:  No significant adenopathy Psych:  Cognition and judgment appear intact. Alert and cooperative with normal attention span and concentration. No apparent delusions, illusions, hallucinations   Impression & Recommendations:  Problem # 1:  OBESITY (ICD-278.00) Assessment Unchanged  Orders: Venipuncture (78295) TLB-Lipid Panel (80061-LIPID) TLB-BMP (Basic Metabolic Panel-BMET) (80048-METABOL) TLB-CBC Platelet - w/Differential (85025-CBCD) TLB-Hepatic/Liver Function Pnl (80076-HEPATIC) TLB-TSH (Thyroid Stimulating Hormone) (62130-QMV) Prescription Created Electronically [phone]) UA Dipstick w/o Micro (automated)  (81003)  Problem # 2:  HYPERTENSION (ICD-401.9) Assessment: Improved  Her updated medication list for this problem includes:    Hydrochlorothiazide 25 Mg Tabs (Hydrochlorothiazide) .Marland Kitchen... Take 1 tablet by mouth every morning  Orders: Venipuncture (62952) TLB-Lipid Panel (80061-LIPID) TLB-BMP (Basic Metabolic Panel-BMET) (80048-METABOL) TLB-CBC Platelet - w/Differential (85025-CBCD) TLB-Hepatic/Liver Function Pnl (80076-HEPATIC) TLB-TSH (Thyroid Stimulating Hormone) (84132-GMW) Prescription Created Electronically [phone]) UA Dipstick w/o Micro (automated)  (81003)  Complete Medication List: 1)  Hydrochlorothiazide 25 Mg Tabs (Hydrochlorothiazide) .... Take 1 tablet by mouth every morning  Patient  Instructions: 1)  It is important that you exercise regularly at least 20 minutes 5 times a week. If you develop chest pain, have severe difficulty breathing, or feel very tired , stop exercising immediately and seek medical attention. 2)  You need to lose weight. Consider a lower calorie diet and regular exercise.  3)  Schedule your mammogram./BSE monthly 4)  Take calcium +Vitamin D daily. 5)  Take an Aspirin every day. 6)  Please schedule a follow-up appointment in 1 year. Prescriptions: HYDROCHLOROTHIAZIDE 25 MG TABS (HYDROCHLOROTHIAZIDE) Take 1 tablet by mouth every morning  #100 x 3   Entered and Authorized by:   Roderick Pee MD   Signed by:   Roderick Pee MD on 04/23/2009   Method used:   Electronically to        [facility]  [phone][facility] (retail)       [address] [place], [place]  [address]       Ph: [phone] or [phone]       Fax: [phone]   RxID:   [id number]     Laboratory Results   Urine Tests    Routine Urinalysis   Color: yellow Appearance: Clear Glucose: negative   (Normal Range: Negative) Bilirubin: negative   (Normal Range: Negative) Ketone: negative   (Normal Range: Negative) Spec. Gravity: 1.025   (Normal Range: 1.003-1.035) Blood: negative   (Normal Range: Negative) pH: 6.0   (Normal Range: 5.0-8.0) Protein: trace   (Normal Range: Negative) Urobilinogen: 0.2   (Normal Range: 0-1) Nitrite: negative   (Normal Range: Negative) Leukocyte Esterace: 1+   (Normal Range: Negative)    Comments: Rita Ohara  April 23, 2009 9:38 AM

## 2010-05-06 NOTE — Miscellaneous (Signed)
Summary: eye exam   Clinical Lists Changes       Eye Exam  cataracts in both eyes, not visually significant arcus senilis in both eyes HTN, no arteriovenous changes seen.

## 2010-05-06 NOTE — Assessment & Plan Note (Signed)
Summary: pt will come in fasting/njr   Vital Signs:  Patient profile:   70 year old female Height:      61.5 inches Weight:      180 pounds BMI:     33.58 Temp:     97.6 degrees F oral BP sitting:   140 / 90  (left arm) Cuff size:   regular  Vitals Entered By: Kern Reap CMA Duncan Dull) (April 27, 2010 8:30 AM)  Primary Care Provider:  Governor Specking, MD   History of Present Illness: Kim Fletcher is a 70 year old, married female, nonsmoker, who comes in today for Medicare wellness examination.  She has a history of a multinodular goiter TSH levels have been normal.  Asymptomatic.  No difficulty swallowing.  She's also had problems with mild hypertension treated with hydrochlorothiazide 25 mg daily.  BP 140/90.  Her weight is 180 pounds,........she has been on a diet program and has lost 15 pounds in the past year..... height 61.5 inches.  We will discuss that exercise and weight loss.  She gets routine eye care, dental care, and you mammography, colonoscopy 2006 showed some polyps and some diverticuli asymptomatic, tetanus, 2004, seasonal flu 2011, Pneumovax 2007, shingles 2009.  Review of systems negative except for two weeks history of frequency and dysuria.  No fever, chills, or back pain.  UA shows moderate Bedore cells, trace blood Here for Medicare AWV:  1.   Risk factors based on Past M, S, F history:......negative, except for above 2.   Physical Activities: ...Marland KitchenMarland KitchenMarland Kitchenadvised to begin walking daily 3.   Depression/mood: good mood.  No depression 4.   Hearing: hearing normal w aids 5.   ADL's: functions independently 6.   Fall Risk: we did not identify 7.   Home Safety: no guns in the house 8.   Height, weight, &visual acuity:height weight, vision normal 9.   Counseling:.... continue diet, exercise and weight loss program 10.   Labs ordered based on risk factors: done today 11.           Referral Coordination.........none indicated 12.           Care Plan.......Marland Kitchenreviewed follow-up on  thyroid 13.            Cognitive Assessment .Marland Kitchen..oriented x 3 does all her own financial work  Allergies: No Known Drug Allergies  Past History:  Past medical, surgical, family and social histories (including risk factors) reviewed, and no changes noted (except as noted below).  Past Medical History: Reviewed history from 04/18/2008 and no changes required. Obese High Cholesterol Hypertension Colonic polyps, hx of GERD Allergies Pancreatitis, hx of  09 statin ???  Past Surgical History: Reviewed history from 12/25/2006 and no changes required. Tonsillectomy  Family History: Reviewed history from 12/31/2007 and no changes required. Family History Lung cancer Family History of Cardiovascular disorder:  Mother  Social History: Reviewed history from 12/31/2007 and no changes required.  Single Alcohol use-yes-4-5 drinks of wine weekly Regular exercise-yes Occupation: [employer] Patient is a former smoker. -stopped 4 years ago  Review of Systems      See HPI  Physical Exam  General:  Well-developed,well-nourished,in no acute distress; alert,appropriate and cooperative throughout examination Head:  Normocephalic and atraumatic without obvious abnormalities. No apparent alopecia or balding. Eyes:  No corneal or conjunctival inflammation noted. EOMI. Perrla. Funduscopic exam benign, without hemorrhages, exudates or papilledema. Vision grossly normal. Ears:  External ear exam shows no significant lesions or deformities.  Otoscopic examination reveals clear canals, tympanic membranes are  intact bilaterally without bulging, retraction, inflammation or discharge. Hearing is grossly normal bilaterally. Nose:  External nasal examination shows no deformity or inflammation. Nasal mucosa are pink and moist without lesions or exudates. Mouth:  Oral mucosa and oropharynx without lesions or exudates.  Teeth in good repair. Neck:  No deformities, masses, or tenderness  noted. Chest Wall:  No deformities, masses, or tenderness noted. Breasts:  No mass, nodules, thickening, tenderness, bulging, retraction, inflamation, nipple discharge or skin changes noted.   Lungs:  Normal respiratory effort, chest expands symmetrically. Lungs are clear to auscultation, no crackles or wheezes. Heart:  Normal rate and regular rhythm. S1 and S2 normal without gallop, murmur, click, rub or other extra sounds. Abdomen:  Bowel sounds positive,abdomen soft and non-tender without masses, organomegaly or hernias noted. Rectal:  No external abnormalities noted. Normal sphincter tone. No rectal masses or tenderness. Genitalia:  extension today within normal limits for age however, extreme redness and dryness.  There is herniation of the bladder and the anterior vaginal wall bimanual exam negative.  Pap done Msk:  No deformity or scoliosis noted of thoracic or lumbar spine.   Pulses:  R and L carotid,radial,femoral,dorsalis pedis and posterior tibial pulses are full and equal bilaterally Extremities:  No clubbing, cyanosis, edema, or deformity noted with normal full range of motion of all joints.   Neurologic:  No cranial nerve deficits noted. Station and gait are normal. Plantar reflexes are down-going bilaterally. DTRs are symmetrical throughout. Sensory, motor and coordinative functions appear intact. Skin:  Intact without suspicious lesions or rashes Cervical Nodes:  No lymphadenopathy noted Axillary Nodes:  No palpable lymphadenopathy Inguinal Nodes:  No significant adenopathy Psych:  Cognition and judgment appear intact. Alert and cooperative with normal attention span and concentration. No apparent delusions, illusions, hallucinations   Problems:  Medical Problems Added: 1)  Dx of Incontinence, Female Stress  (ICD-625.6) 2)  Dx of Cystitis, Acute  (ICD-595.0)  Impression & Recommendations:  Problem # 1:  INCONTINENCE, FEMALE STRESS (ICD-625.6) Assessment  New  Orders: Venipuncture (14782) Prescription Created Electronically [phone]) Medicare -1st Annual Wellness Visit [phone]) Specimen Handling (78469) Urinalysis-dipstick only (Medicare patient) (81003QW) TLB-Lipid Panel (80061-LIPID) TLB-BMP (Basic Metabolic Panel-BMET) (80048-METABOL) TLB-CBC Platelet - w/Differential (85025-CBCD) TLB-Hepatic/Liver Function Pnl (80076-HEPATIC) TLB-TSH (Thyroid Stimulating Hormone) (84443-TSH)  Problem # 2:  CYSTITIS, ACUTE (ICD-595.0) Assessment: New  Orders: Venipuncture (62952) Prescription Created Electronically [phone]) Medicare -1st Annual Wellness Visit [phone]) Specimen Handling (27253) TLB-Lipid Panel (80061-LIPID) TLB-BMP (Basic Metabolic Panel-BMET) (80048-METABOL) TLB-CBC Platelet - w/Differential (85025-CBCD) TLB-Hepatic/Liver Function Pnl (80076-HEPATIC) TLB-TSH (Thyroid Stimulating Hormone) (84443-TSH)  Her updated medication list for this problem includes:    Septra Ds 800-160 Mg Tabs (Sulfamethoxazole-trimethoprim) .Marland Kitchen... Take 1 tablet by mouth two times a day  Problem # 3:  GOITER, MULTINODULAR (ICD-241.1) Assessment: Unchanged  Orders: Venipuncture (66440) Prescription Created Electronically [phone]) Medicare -1st Annual Wellness Visit [phone]) Specimen Handling (87564) TLB-Lipid Panel (80061-LIPID) TLB-BMP (Basic Metabolic Panel-BMET) (80048-METABOL) TLB-CBC Platelet - w/Differential (85025-CBCD) TLB-Hepatic/Liver Function Pnl (80076-HEPATIC) TLB-TSH (Thyroid Stimulating Hormone) (84443-TSH)  Problem # 4:  HEALTH SCREENING (ICD-V70.0) Assessment: Improved  Orders: Venipuncture (33295) Prescription Created Electronically [phone]) Medicare -1st Annual Wellness Visit [phone]) Specimen Handling (01601) EKG w/ Interpretation (93000) TLB-Lipid Panel (80061-LIPID) TLB-BMP (Basic Metabolic Panel-BMET) (80048-METABOL) TLB-CBC Platelet - w/Differential (85025-CBCD) TLB-Hepatic/Liver Function Pnl (80076-HEPATIC) TLB-TSH  (Thyroid Stimulating Hormone) (84443-TSH)  Problem # 5:  OBESITY (ICD-278.00) Assessment: Improved  Orders: Venipuncture (09323) Prescription Created Electronically [phone]) Medicare -1st Annual Wellness Visit [phone]) Specimen Handling (27062) EKG w/ Interpretation (93000)  TLB-Lipid Panel (80061-LIPID) TLB-BMP (Basic Metabolic Panel-BMET) (80048-METABOL) TLB-CBC Platelet - w/Differential (85025-CBCD) TLB-Hepatic/Liver Function Pnl (80076-HEPATIC) TLB-TSH (Thyroid Stimulating Hormone) (84443-TSH)  Complete Medication List: 1)  Hydrochlorothiazide 25 Mg Tabs (Hydrochlorothiazide) .... Take 1 tablet by mouth every morning 2)  Premarin 0.625 Mg/gm Crea (Estrogens, conjugated) .... Uad 3)  Septra Ds 800-160 Mg Tabs (Sulfamethoxazole-trimethoprim) .... Take 1 tablet by mouth two times a day  Patient Instructions: 1)  continue your diet, exercise and weight loss program. 2)  Use the Premarin vaginal cream twice weekly. 3)  BSE monthly 4)  Please schedule a follow-up appointment in 1 year. 5)  Schedule your mammogram. 6)  Take an Aspirin every day. Prescriptions: HYDROCHLOROTHIAZIDE 25 MG TABS (HYDROCHLOROTHIAZIDE) Take 1 tablet by mouth every morning  #100 x 3   Entered and Authorized by:   Roderick Pee MD   Signed by:   Roderick Pee MD on 04/27/2010   Method used:   [facility] to        [facility]  [phone][facility] (retail)       [address] [place], [place]  [address]       Ph: [phone] or [phone]       Fax: [phone]   RxID:   [id number] SEPTRA DS 800-160 MG TABS (SULFAMETHOXAZOLE-TRIMETHOPRIM) Take 1 tablet by mouth two times a day  #20 x 1   Entered and Authorized by:   Roderick Pee MD   Signed by:   Roderick Pee MD on 04/27/2010   Method used:   [facility]ically to        [facility]  [phone]* (retail)       [address] [place], [place]  [address]       Ph: [phone] or [phone]       Fax: [phone]   RxID:    [id number] PREMARIN 0.625 MG/GM CREA (ESTROGENS, CONJUGATED) UAD  #2 tubes x 6   Entered and Authorized by:   Roderick Pee MD   Signed by:   Roderick Pee MD on 04/27/2010   Method used:   [facility] to        [facility]  [phone]* (retail)       [address] [place], [place]  [address]       Ph: [phone] or [phone]       Fax: [phone]   RxID:   [id number]    Orders Added: 1)  Venipuncture [01751] 2)  Prescription Created Electronically [phone] 3)  Medicare -1st Annual Wellness Visit [G0438] 4)  Specimen Handling [99000] 5)  Urinalysis-dipstick only (Medicare patient) [81003QW] 6)  EKG w/ Interpretation [93000] 7)  TLB-Lipid Panel [80061-LIPID] 8)  TLB-BMP (Basic Metabolic Panel-BMET) [80048-METABOL] 9)  TLB-CBC Platelet - w/Differential [85025-CBCD] 10)  TLB-Hepatic/Liver Function Pnl [80076-HEPATIC] 11)  TLB-TSH (Thyroid Stimulating Hormone) [84443-TSH]     Laboratory Results   Urine Tests  Date/Time Received: April 27, 2010   Routine Urinalysis   Color: yellow Appearance: Clear Glucose: negative   (Normal Range: Negative) Bilirubin: negative   (Normal Range: Negative) Ketone: negative   (Normal Range: Negative) Spec. Gravity: 1.010   (Normal Range: 1.003-1.035) Blood: trace-lysed   (Normal Range: Negative) pH: 6.0   (Normal Range: 5.0-8.0) Protein: negative   (Normal Range: Negative) Urobilinogen: 0.2   (Normal Range: 0-1) Nitrite: negative   (Normal Range: Negative) Leukocyte Esterace: trace   (  Normal Range: Negative)    Comments: Kern Reap CMA Duncan Dull)  April 27, 2010 9:50 AM

## 2010-05-12 NOTE — Miscellaneous (Signed)
Summary: mammogram update   Clinical Lists Changes  Observations: Added new observation of MAMMOGRAM: Bi-rads 2 benign (04/22/2010 17:20)      Preventive Care Screening  Mammogram:    Date:  04/22/2010    Results:  Bi-rads 2 benign

## 2010-07-11 ENCOUNTER — Inpatient Hospital Stay (INDEPENDENT_AMBULATORY_CARE_PROVIDER_SITE_OTHER)
Admission: RE | Admit: 2010-07-11 | Discharge: 2010-07-11 | Disposition: A | Discharge status: Home / Self Care | Payer: Medicare Other | Source: Ambulatory Visit | Attending: Emergency Medicine | Admitting: Emergency Medicine

## 2010-07-11 DIAGNOSIS — S51809A Unspecified open wound of unspecified forearm, initial encounter: Secondary | ICD-10-CM

## 2010-07-19 ENCOUNTER — Inpatient Hospital Stay (HOSPITAL_COMMUNITY)
Admission: RE | Admit: 2010-07-19 | Discharge: 2010-07-19 | Disposition: A | Discharge status: Home / Self Care | Payer: Medicare Other | Source: Ambulatory Visit | Attending: Emergency Medicine | Admitting: Emergency Medicine

## 2010-09-07 ENCOUNTER — Telehealth: Payer: Self-pay | Admitting: Family Medicine

## 2010-09-07 NOTE — Telephone Encounter (Signed)
Okay to work in

## 2010-09-07 NOTE — Telephone Encounter (Signed)
Pls advise.  

## 2010-09-07 NOTE — Telephone Encounter (Signed)
Pt called req to get a work in ov with Dr Tawanna Cooler this wk to discuss premarin and also has sinus inf.

## 2010-09-08 ENCOUNTER — Ambulatory Visit: Payer: Self-pay | Admitting: Family Medicine

## 2010-09-08 NOTE — Telephone Encounter (Signed)
Called pt and sch her for 2:15 ov 09/09/10, as noted.

## 2010-09-08 NOTE — Telephone Encounter (Signed)
Pt can be seen tomorrow at 215

## 2010-09-09 ENCOUNTER — Ambulatory Visit (INDEPENDENT_AMBULATORY_CARE_PROVIDER_SITE_OTHER): Payer: Medicare Other | Admitting: Family Medicine

## 2010-09-09 ENCOUNTER — Encounter: Payer: Self-pay | Admitting: Family Medicine

## 2010-09-09 VITALS — BP 110/80 | Ht 62.0 in | Wt 182.0 lb

## 2010-09-09 DIAGNOSIS — J301 Allergic rhinitis due to pollen: Secondary | ICD-10-CM

## 2010-09-09 MED ORDER — BENZONATATE 100 MG PO CAPS
100.0000 mg | ORAL_CAPSULE | Freq: Four times a day (QID) | ORAL | Status: AC | PRN
Start: 2010-09-09 — End: 2011-09-09

## 2010-09-09 MED ORDER — PREDNISONE 20 MG PO TABS: ORAL_TABLET | ORAL | Status: DC

## 2010-09-09 NOTE — Progress Notes (Signed)
  Subjective:    Patient ID: Kim Fletcher, female    DOB: 04-10-1940, 70 y.o.   MRN: [id number]  [place]away is a 70 year old female, who comes in today with symptoms of allergic rhinitis for 11 days.  She states she went to see her daughter, who has a dog, and then she developed a congestion, postnasal drip, cough.  Review of systems otherwise negative    Review of Systems    General immunologic review of systems otherwise negative Objective:   Physical Exam    Well-developed well-nourished, female, in no acute distress.  HEENT negative.  Neck supple.  No adenopathy.  Lungs are clear    Assessment & Plan:  Allergic rhinitis.  Plan Claritin 10 mg daily if symptoms persist.  Short course of prednisone

## 2010-09-09 NOTE — Patient Instructions (Signed)
10 mg plain Claritin, now, then 10 mg every morning starting tomorrow morning.  If in a week to 10 days.  She is still symptomatic, then take a short course of prednisone.  Tessalon as needed

## 2010-09-14 ENCOUNTER — Other Ambulatory Visit: Payer: Self-pay

## 2010-09-21 ENCOUNTER — Ambulatory Visit: Payer: Self-pay | Admitting: Family Medicine

## 2010-10-01 ENCOUNTER — Ambulatory Visit (INDEPENDENT_AMBULATORY_CARE_PROVIDER_SITE_OTHER): Payer: Medicare Other | Admitting: Internal Medicine

## 2010-10-01 ENCOUNTER — Encounter: Payer: Self-pay | Admitting: Internal Medicine

## 2010-10-01 VITALS — BP 100/70 | Temp 98.1°F

## 2010-10-01 DIAGNOSIS — T300 Burn of unspecified body region, unspecified degree: Secondary | ICD-10-CM

## 2010-10-01 DIAGNOSIS — T304 Corrosion of unspecified body region, unspecified degree: Secondary | ICD-10-CM

## 2010-10-01 DIAGNOSIS — IMO0002 Reserved for concepts with insufficient information to code with codable children: Secondary | ICD-10-CM

## 2010-10-01 NOTE — Patient Instructions (Signed)
Use  aloe  cream as needed to keep the area moist  Call or return to clinic prn if these symptoms worsen or fail to improve as anticipated.  Call if  You  Develop  worsening pain redness or any drainage
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
## 2010-10-27 ENCOUNTER — Other Ambulatory Visit: Payer: Self-pay | Admitting: *Deleted

## 2010-10-27 MED ORDER — ESTROGENS, CONJUGATED 0.625 MG/GM VA CREA: 1.0000 g | TOPICAL_CREAM | Freq: Every day | VAGINAL | Status: DC

## 2010-10-27 MED ORDER — HYDROCHLOROTHIAZIDE 25 MG PO TABS: 25.0000 mg | ORAL_TABLET | Freq: Every day | ORAL | Status: DC

## 2011-01-05 LAB — DIFFERENTIAL
Basophils Absolute: 0.4 — ABNORMAL HIGH
Basophils Relative: 3 — ABNORMAL HIGH
Eosinophils Absolute: 0
Monocytes Relative: 6
Neutro Abs: 12.1 — ABNORMAL HIGH
Neutrophils Relative %: 81 — ABNORMAL HIGH

## 2011-01-05 LAB — URINALYSIS, ROUTINE W REFLEX MICROSCOPIC
Glucose, UA: NEGATIVE
Ketones, ur: 15 — AB
Nitrite: NEGATIVE
Protein, ur: 100 — AB
Specific Gravity, Urine: >1.03 — ABNORMAL HIGH
Urobilinogen, UA: 0.2
pH: 5

## 2011-01-05 LAB — CBC
MCHC: 33.4
MCV: 92.5
RBC: 4.06
RDW: 12.6

## 2011-01-05 LAB — POCT I-STAT, CHEM 8
Calcium, Ion: 1.24
Chloride: 100
Glucose, Bld: 116 — ABNORMAL HIGH
HCT: 40
Hemoglobin: 13.6
Potassium: 3.4 — ABNORMAL LOW

## 2011-01-05 LAB — HEPATIC FUNCTION PANEL
ALT: 17
AST: 19
Albumin: 3.5
Alkaline Phosphatase: 63
Bilirubin, Direct: 0.2
Indirect Bilirubin: 0.9
Total Bilirubin: 1.1
Total Protein: 7.2

## 2011-01-05 LAB — LIPASE, BLOOD: Lipase: 57

## 2011-01-05 LAB — POCT URINALYSIS DIP (DEVICE)
Glucose, UA: 100 — AB
Nitrite: POSITIVE — AB
Operator id: 30745
Protein, ur: 100 — AB
Urobilinogen, UA: 0.2

## 2011-01-05 LAB — URINE MICROSCOPIC-ADD ON

## 2011-01-05 LAB — URINE CULTURE: Colony Count: 50000

## 2011-02-07 ENCOUNTER — Other Ambulatory Visit: Payer: Self-pay | Admitting: Dermatology

## 2011-03-09 ENCOUNTER — Ambulatory Visit: Payer: Medicare Other | Admitting: Family Medicine

## 2011-04-25 ENCOUNTER — Other Ambulatory Visit (INDEPENDENT_AMBULATORY_CARE_PROVIDER_SITE_OTHER): Payer: Medicare Other

## 2011-04-25 DIAGNOSIS — Z79899 Other long term (current) drug therapy: Secondary | ICD-10-CM

## 2011-04-25 DIAGNOSIS — Z0000 Encounter for general adult medical examination without abnormal findings: Secondary | ICD-10-CM

## 2011-04-25 LAB — CBC WITH DIFFERENTIAL/PLATELET
Basophils Absolute: 0 10*3/uL (ref 0.0–0.1)
Eosinophils Absolute: 0.2 10*3/uL (ref 0.0–0.7)
Lymphocytes Relative: 24.4 % (ref 12.0–46.0)
MCHC: 33.6 g/dL (ref 30.0–36.0)
MCV: 95.7 fl (ref 78.0–100.0)
Monocytes Absolute: 0.5 10*3/uL (ref 0.1–1.0)
Neutro Abs: 3.1 10*3/uL (ref 1.4–7.7)
Neutrophils Relative %: 60.8 % (ref 43.0–77.0)
RDW: 13.3 % (ref 11.5–14.6)

## 2011-04-25 LAB — HEPATIC FUNCTION PANEL
Albumin: 3.7 g/dL (ref 3.5–5.2)
Alkaline Phosphatase: 57 U/L (ref 39–117)
Bilirubin, Direct: 0.1 mg/dL (ref 0.0–0.3)

## 2011-04-25 LAB — POCT URINALYSIS DIPSTICK
Bilirubin, UA: NEGATIVE
Blood, UA: NEGATIVE
Ketones, UA: NEGATIVE
Leukocytes, UA: NEGATIVE
Protein, UA: NEGATIVE
pH, UA: 7

## 2011-04-25 LAB — BASIC METABOLIC PANEL
BUN: 24 mg/dL — ABNORMAL HIGH (ref 6–23)
CO2: 28 mEq/L (ref 19–32)
Calcium: 9 mg/dL (ref 8.4–10.5)
Creatinine, Ser: 1.1 mg/dL (ref 0.4–1.2)
Glucose, Bld: 92 mg/dL (ref 70–99)

## 2011-04-25 LAB — LIPID PANEL
Cholesterol: 256 mg/dL — ABNORMAL HIGH (ref 0–200)
HDL: 68.3 mg/dL (ref >39.00)
Triglycerides: 82 mg/dL (ref 0.0–149.0)

## 2011-05-02 ENCOUNTER — Encounter: Payer: Self-pay | Admitting: Family Medicine

## 2011-05-02 ENCOUNTER — Ambulatory Visit (INDEPENDENT_AMBULATORY_CARE_PROVIDER_SITE_OTHER): Payer: Medicare Other | Admitting: Family Medicine

## 2011-05-02 DIAGNOSIS — Z0000 Encounter for general adult medical examination without abnormal findings: Secondary | ICD-10-CM

## 2011-05-02 DIAGNOSIS — J45909 Unspecified asthma, uncomplicated: Secondary | ICD-10-CM

## 2011-05-02 DIAGNOSIS — E782 Mixed hyperlipidemia: Secondary | ICD-10-CM

## 2011-05-02 DIAGNOSIS — N393 Stress incontinence (female) (male): Secondary | ICD-10-CM

## 2011-05-02 DIAGNOSIS — E0590 Thyrotoxicosis, unspecified without thyrotoxic crisis or storm: Secondary | ICD-10-CM

## 2011-05-02 DIAGNOSIS — Z86010 Personal history of colonic polyps: Secondary | ICD-10-CM

## 2011-05-02 DIAGNOSIS — N952 Postmenopausal atrophic vaginitis: Secondary | ICD-10-CM

## 2011-05-02 DIAGNOSIS — J301 Allergic rhinitis due to pollen: Secondary | ICD-10-CM

## 2011-05-02 DIAGNOSIS — E669 Obesity, unspecified: Secondary | ICD-10-CM

## 2011-05-02 DIAGNOSIS — I10 Essential (primary) hypertension: Secondary | ICD-10-CM

## 2011-05-02 MED ORDER — PREDNISONE 20 MG PO TABS: ORAL_TABLET | ORAL | Status: DC

## 2011-05-02 MED ORDER — HYDROCHLOROTHIAZIDE 25 MG PO TABS: 25.0000 mg | ORAL_TABLET | Freq: Every day | ORAL | Status: DC

## 2011-05-02 MED ORDER — ESTROGENS, CONJUGATED 0.625 MG/GM VA CREA: 1.0000 g | TOPICAL_CREAM | Freq: Every day | VAGINAL | Status: DC

## 2011-05-02 MED ORDER — HYDROCODONE-HOMATROPINE 5-1.5 MG/5ML PO SYRP: ORAL_SOLUTION | ORAL | Status: DC

## 2011-05-02 NOTE — Progress Notes (Signed)
  Subjective:    Patient ID: Kim Fletcher, female    DOB: 1940-04-10, 71 y.o.   MRN: [id number]  HPI Kim Fletcher is a 71 year old, married female, nonsmoker  Who comes in today for general Medicare wellness examination because of a history of underlying postmenopausal vaginal dryness and urinary incontinence, mild hypertension, controlled with hydrocortisone is on 25 mg daily, and a new problem of a cold with secondary wheezing.  Her medication reviewed.  The been no changes.  She has routine eye care.  Bilateral cataracts.  Vision, corrected with glasses, bilateral hearing aids, routine dental care, colonoscopy, and GI, tetanus, 2004, Pneumovax 2007, shingles 2009, seasonal flu shot 2012.  Cognitive function, normal.  She and her husband maintain their own financial affairs.  On good days.  She walks on a regular basis twice with her dog, home health safety reviewed.  No issues identified, she did have a healthcare power of attorney, and a living Will and the guns in the house.  Review of systems negative except 3 days ago she developed a cold and today she feels like she might be wheezing.   Review of Systems  Constitutional: Negative.   HENT: Negative.   Eyes: Negative.   Respiratory: Positive for wheezing.   Cardiovascular: Negative.   Gastrointestinal: Negative.   Genitourinary: Negative.   Musculoskeletal: Negative.   Neurological: Negative.   Hematological: Negative.   Psychiatric/Behavioral: Negative.        Objective:   Physical Exam  Constitutional: She appears well-developed and well-nourished.  HENT:  Head: Normocephalic and atraumatic.  Right Ear: External ear normal.  Left Ear: External ear normal.  Nose: Nose normal.  Mouth/Throat: Oropharynx is clear and moist.  Eyes: EOM are normal. Pupils are equal, round, and reactive to light.  Neck: Normal range of motion. Neck supple. No thyromegaly present.  Cardiovascular: Normal rate, regular rhythm, normal heart sounds and  intact distal pulses.  Exam reveals no gallop and no friction rub.   No murmur heard. Pulmonary/Chest: Effort normal. She has wheezes.       Inspiratory rate 12 and unlabored.  Head, breath sounds normal, except for symmetrical late, mild expiratory wheezing  Abdominal: Soft. Bowel sounds are normal. She exhibits no distension and no mass. There is no tenderness. There is no rebound.  Genitourinary: Vagina normal and uterus normal. Guaiac negative stool. No vaginal discharge found.  Musculoskeletal: Normal range of motion.  Lymphadenopathy:    She has no cervical adenopathy.  Neurological: She is alert. She has normal reflexes. No cranial nerve deficit. She exhibits normal muscle tone. Coordination normal.  Skin: Skin is warm and dry.  Psychiatric: She has a normal mood and affect. Her behavior is normal. Judgment and thought content normal.          Assessment & Plan:  Healthy female.  Postmenopausal vaginal dryness, and urinary incontinence.  Continue Premarin vaginal cream, and exercises.  Mild hypertension, and venous insufficiency.  Continue hydrochlorothiazide 25 mg daily.  Viral syndrome with secondary wheezing prednisone burst and taper

## 2011-05-02 NOTE — Patient Instructions (Signed)
Continue your current medications.  Drink lots of water.  Take the prednisone as directed.  Hydromet one half to 1 teaspoon at bed time p.r.n. For nighttime cough.  Return p.r.n.

## 2011-05-03 ENCOUNTER — Telehealth: Payer: Self-pay | Admitting: Family Medicine

## 2011-05-03 DIAGNOSIS — J45909 Unspecified asthma, uncomplicated: Secondary | ICD-10-CM

## 2011-05-03 MED ORDER — PREDNISONE 20 MG PO TABS: ORAL_TABLET | ORAL | Status: DC

## 2011-05-03 NOTE — Telephone Encounter (Signed)
Pt called and said that the script for Prednisone needs to be called in to [facility] on [address]. Pls call this in today.

## 2011-06-27 ENCOUNTER — Ambulatory Visit: Payer: Medicare Other | Attending: Orthopedic Surgery

## 2011-06-27 DIAGNOSIS — R262 Difficulty in walking, not elsewhere classified: Secondary | ICD-10-CM | POA: Insufficient documentation

## 2011-06-27 DIAGNOSIS — IMO0001 Reserved for inherently not codable concepts without codable children: Secondary | ICD-10-CM | POA: Insufficient documentation

## 2011-06-27 DIAGNOSIS — M25579 Pain in unspecified ankle and joints of unspecified foot: Secondary | ICD-10-CM | POA: Insufficient documentation

## 2011-06-30 ENCOUNTER — Ambulatory Visit: Payer: Medicare Other

## 2011-07-04 ENCOUNTER — Ambulatory Visit: Payer: Medicare Other | Attending: Orthopedic Surgery

## 2011-07-04 DIAGNOSIS — R262 Difficulty in walking, not elsewhere classified: Secondary | ICD-10-CM | POA: Insufficient documentation

## 2011-07-04 DIAGNOSIS — IMO0001 Reserved for inherently not codable concepts without codable children: Secondary | ICD-10-CM | POA: Insufficient documentation

## 2011-07-04 DIAGNOSIS — M25579 Pain in unspecified ankle and joints of unspecified foot: Secondary | ICD-10-CM | POA: Insufficient documentation

## 2011-07-07 ENCOUNTER — Ambulatory Visit: Payer: Medicare Other

## 2011-07-11 ENCOUNTER — Ambulatory Visit: Payer: Medicare Other

## 2011-07-14 ENCOUNTER — Ambulatory Visit: Payer: Medicare Other | Admitting: Physical Therapy

## 2011-07-21 ENCOUNTER — Encounter: Payer: Medicare Other | Admitting: Physical Therapy

## 2011-08-18 ENCOUNTER — Telehealth: Payer: Self-pay | Admitting: *Deleted

## 2011-08-18 ENCOUNTER — Ambulatory Visit (INDEPENDENT_AMBULATORY_CARE_PROVIDER_SITE_OTHER): Payer: Medicare Other | Admitting: Family Medicine

## 2011-08-18 ENCOUNTER — Ambulatory Visit: Payer: Medicare Other | Admitting: Family Medicine

## 2011-08-18 NOTE — Telephone Encounter (Signed)
Pt to see Dr. Tawanna Cooler today.

## 2011-10-10 ENCOUNTER — Telehealth: Payer: Self-pay | Admitting: Family Medicine

## 2011-10-10 DIAGNOSIS — I10 Essential (primary) hypertension: Secondary | ICD-10-CM

## 2011-10-10 MED ORDER — HYDROCHLOROTHIAZIDE 25 MG PO TABS: 25.0000 mg | ORAL_TABLET | Freq: Every day | ORAL | Status: DC

## 2011-10-10 NOTE — Telephone Encounter (Signed)
Pt did not receive her scripts from her mail order pharmacy on Saturday before she went on vacation. Now she is totally out of her hydrochlorothiazide (HYDRODIURIL) 25 MG tablet. Wants to know if we can call it in to [facility] in [facility] in [place] Their ph# [phone]. Pt requesting a call back. Thank you.

## 2011-10-10 NOTE — Telephone Encounter (Signed)
New rx sent

## 2011-11-17 ENCOUNTER — Other Ambulatory Visit: Payer: Self-pay | Admitting: *Deleted

## 2011-11-17 DIAGNOSIS — I10 Essential (primary) hypertension: Secondary | ICD-10-CM

## 2011-11-17 MED ORDER — HYDROCHLOROTHIAZIDE 25 MG PO TABS: 25.0000 mg | ORAL_TABLET | Freq: Every day | ORAL | Status: DC

## 2011-11-22 ENCOUNTER — Other Ambulatory Visit: Payer: Self-pay | Admitting: Dermatology

## 2012-01-11 ENCOUNTER — Ambulatory Visit (INDEPENDENT_AMBULATORY_CARE_PROVIDER_SITE_OTHER): Payer: Medicare Other | Admitting: Family Medicine

## 2012-01-11 DIAGNOSIS — Z23 Encounter for immunization: Secondary | ICD-10-CM

## 2012-07-17 ENCOUNTER — Other Ambulatory Visit: Payer: Self-pay

## 2012-07-24 ENCOUNTER — Encounter: Payer: Self-pay | Admitting: Family Medicine

## 2012-08-15 ENCOUNTER — Other Ambulatory Visit: Payer: Medicare Other

## 2012-08-17 ENCOUNTER — Other Ambulatory Visit (INDEPENDENT_AMBULATORY_CARE_PROVIDER_SITE_OTHER): Payer: Medicare Other

## 2012-08-17 DIAGNOSIS — Z0000 Encounter for general adult medical examination without abnormal findings: Secondary | ICD-10-CM

## 2012-08-17 DIAGNOSIS — I10 Essential (primary) hypertension: Secondary | ICD-10-CM

## 2012-08-17 LAB — POCT URINALYSIS DIPSTICK
Bilirubin, UA: NEGATIVE
Ketones, UA: NEGATIVE
Leukocytes, UA: NEGATIVE
Nitrite, UA: NEGATIVE
pH, UA: 7.5

## 2012-08-17 LAB — CBC WITH DIFFERENTIAL/PLATELET
Basophils Absolute: 0.1 10*3/uL (ref 0.0–0.1)
Eosinophils Absolute: 0.2 10*3/uL (ref 0.0–0.7)
Eosinophils Relative: 3.1 % (ref 0.0–5.0)
HCT: 37.4 % (ref 36.0–46.0)
Lymphs Abs: 2 10*3/uL (ref 0.7–4.0)
MCHC: 34.1 g/dL (ref 30.0–36.0)
MCV: 93.5 fl (ref 78.0–100.0)
Monocytes Absolute: 0.5 10*3/uL (ref 0.1–1.0)
Neutrophils Relative %: 59.6 % (ref 43.0–77.0)
Platelets: 232 10*3/uL (ref 150.0–400.0)
RDW: 12.8 % (ref 11.5–14.6)
WBC: 6.7 10*3/uL (ref 4.5–10.5)

## 2012-08-17 LAB — HEPATIC FUNCTION PANEL
ALT: 19 U/L (ref 0–35)
Bilirubin, Direct: 0 mg/dL (ref 0.0–0.3)
Total Bilirubin: 0.4 mg/dL (ref 0.3–1.2)

## 2012-08-17 LAB — BASIC METABOLIC PANEL
BUN: 23 mg/dL (ref 6–23)
CO2: 30 mEq/L (ref 19–32)
Chloride: 104 mEq/L (ref 96–112)
Creatinine, Ser: 1.1 mg/dL (ref 0.4–1.2)
Glucose, Bld: 87 mg/dL (ref 70–99)
Potassium: 4.7 mEq/L (ref 3.5–5.1)

## 2012-08-17 LAB — TSH: TSH: 0.73 u[IU]/mL (ref 0.35–5.50)

## 2012-08-17 LAB — LIPID PANEL
Cholesterol: 239 mg/dL — ABNORMAL HIGH (ref 0–200)
HDL: 55.4 mg/dL (ref >39.00)

## 2012-08-23 ENCOUNTER — Encounter: Payer: Self-pay | Admitting: Family Medicine

## 2012-08-30 ENCOUNTER — Other Ambulatory Visit (HOSPITAL_COMMUNITY)
Admission: RE | Admit: 2012-08-30 | Discharge: 2012-08-30 | Disposition: A | Discharge status: Home / Self Care | Payer: Medicare Other | Source: Ambulatory Visit | Attending: Family Medicine | Admitting: Family Medicine

## 2012-08-30 ENCOUNTER — Encounter: Payer: Self-pay | Admitting: Family Medicine

## 2012-08-30 ENCOUNTER — Ambulatory Visit (INDEPENDENT_AMBULATORY_CARE_PROVIDER_SITE_OTHER): Payer: Medicare Other | Admitting: Family Medicine

## 2012-08-30 VITALS — BP 120/84 | Temp 97.7°F | Ht 61.25 in | Wt 203.0 lb

## 2012-08-30 DIAGNOSIS — N393 Stress incontinence (female) (male): Secondary | ICD-10-CM

## 2012-08-30 DIAGNOSIS — Z01419 Encounter for gynecological examination (general) (routine) without abnormal findings: Secondary | ICD-10-CM

## 2012-08-30 DIAGNOSIS — M25569 Pain in unspecified knee: Secondary | ICD-10-CM

## 2012-08-30 DIAGNOSIS — I10 Essential (primary) hypertension: Secondary | ICD-10-CM

## 2012-08-30 DIAGNOSIS — N952 Postmenopausal atrophic vaginitis: Secondary | ICD-10-CM

## 2012-08-30 DIAGNOSIS — M25561 Pain in right knee: Secondary | ICD-10-CM | POA: Insufficient documentation

## 2012-08-30 DIAGNOSIS — Z0000 Encounter for general adult medical examination without abnormal findings: Secondary | ICD-10-CM

## 2012-08-30 DIAGNOSIS — E669 Obesity, unspecified: Secondary | ICD-10-CM

## 2012-08-30 DIAGNOSIS — Z23 Encounter for immunization: Secondary | ICD-10-CM

## 2012-08-30 DIAGNOSIS — Z86010 Personal history of colonic polyps: Secondary | ICD-10-CM

## 2012-08-30 MED ORDER — HYDROCHLOROTHIAZIDE 25 MG PO TABS: 25.0000 mg | ORAL_TABLET | Freq: Every day | ORAL | Status: DC

## 2012-08-30 MED ORDER — ESTROGENS, CONJUGATED 0.625 MG/GM VA CREA: 1.0000 g | TOPICAL_CREAM | Freq: Every day | VAGINAL | Status: DC

## 2012-08-30 NOTE — Patient Instructions (Signed)
We will set you up a nutrition consult to assist you in your goal for weight loss  Continue exercise program  With your other medications you can safely take Motrin 400 mg twice daily with food  Do a thorough breast exam monthly  Followup in 1 year sooner if any problems

## 2012-08-30 NOTE — Progress Notes (Signed)
  Subjective:    Patient ID: Kim Fletcher, female    DOB: 09-Dec-1940, 72 y.o.   MRN: [id number]  HPI Fayth is a delightful 72 year old female nonsmoker who comes in today for a Medicare wellness examination  She continues to struggle with her weight. She is going to the [facility] for water aerobics. She is agreeable to see a nutritionist  She uses Premarin vaginal cream for vaginal dryness twice weekly  She takes hydrochlorothiazide 25 mg daily for hypertension  She gets routine eye care, regular dental care, annual mammography, screening colonoscopies because of a history of colon polyps. Vaccinations up-to-date except she needs a tetanus booster and her second Pneumovax  Cognitive function normal she's walking on it a regular basis in the pool at the [facility], home health safety reviewed no issues identified, no guns in the house, she does have a health care power of attorney and living well   Review of Systems  Constitutional: Negative.   HENT: Negative.   Eyes: Negative.   Respiratory: Negative.   Cardiovascular: Negative.   Gastrointestinal: Negative.   Genitourinary: Negative.   Musculoskeletal: Negative.   Neurological: Negative.   Psychiatric/Behavioral: Negative.        Objective:   Physical Exam  Constitutional: She appears well-developed and well-nourished.  HENT:  Head: Normocephalic and atraumatic.  Right Ear: External ear normal.  Left Ear: External ear normal.  Nose: Nose normal.  Mouth/Throat: Oropharynx is clear and moist.  Eyes: EOM are normal. Pupils are equal, round, and reactive to light.  Neck: Normal range of motion. Neck supple. No thyromegaly present.  Cardiovascular: Normal rate, regular rhythm, normal heart sounds and intact distal pulses.  Exam reveals no gallop and no friction rub.   No murmur heard. Carotids and aorta normal peripheral pulses 2+ and symmetrical significant varicose veins  Pulmonary/Chest: Effort normal and breath sounds normal.   Abdominal: Soft. Bowel sounds are normal. She exhibits no distension and no mass. There is no tenderness. There is no rebound.  Genitourinary: Vagina normal and uterus normal. Guaiac negative stool. No vaginal discharge found.  Bilateral breast exam normal pelvic exam normal except for some cystocele 2+  Musculoskeletal: Normal range of motion.  Symmetrical swelling of both knees consistent with osteoarthritis  Lymphadenopathy:    She has no cervical adenopathy.  Neurological: She is alert. She has normal reflexes. No cranial nerve deficit. She exhibits normal muscle tone. Coordination normal.  Skin: Skin is warm and dry.  Total body skin exam normal  Psychiatric: She has a normal mood and affect. Her behavior is normal. Judgment and thought content normal.          Assessment & Plan:  Healthy female  Obesity referred to the nutrition clinic for weight loss  Postmenopausal vaginal dryness and urinary incontinence continued exercises twice daily and Premarin vaginal cream twice weekly  Hypertension continue medical fossae 25 mg daily

## 2013-01-04 ENCOUNTER — Ambulatory Visit (INDEPENDENT_AMBULATORY_CARE_PROVIDER_SITE_OTHER): Payer: Medicare Other

## 2013-01-04 DIAGNOSIS — Z23 Encounter for immunization: Secondary | ICD-10-CM

## 2013-04-03 ENCOUNTER — Encounter: Payer: Self-pay | Admitting: Family Medicine

## 2013-04-03 ENCOUNTER — Ambulatory Visit (INDEPENDENT_AMBULATORY_CARE_PROVIDER_SITE_OTHER): Payer: Medicare Other | Admitting: Family Medicine

## 2013-04-03 VITALS — BP 120/80 | Temp 97.9°F | Wt 201.0 lb

## 2013-04-03 DIAGNOSIS — J069 Acute upper respiratory infection, unspecified: Secondary | ICD-10-CM

## 2013-04-03 MED ORDER — BENZONATATE 100 MG PO CAPS: 100.0000 mg | ORAL_CAPSULE | Freq: Two times a day (BID) | ORAL | Status: DC | PRN

## 2013-04-03 NOTE — Patient Instructions (Signed)

## 2013-04-03 NOTE — Progress Notes (Signed)
Chief Complaint  Patient presents with  . Cough    congestion, drainage, ears itching     HPI:  -started: 3 days ago -symptoms:nasal congestion, sore throat, cough, ears itchy, drainage -denies:fever, SOB, NVD, tooth pain -has tried: nothing -sick contacts/travel/risks: denies flu exposure or Ebola risks, daughter with a cold -Hx of: allergies  ROS: See pertinent positives and negatives per HPI.  Past Medical History  Diagnosis Date  . Obese   . Hypertension   . Hyperlipidemia   . History of colonic polyps   . GERD (gastroesophageal reflux disease)   . Allergy   . Pancreatitis     Past Surgical History  Procedure Laterality Date  . Tonsillectomy    . Mohs surgery      right arm    Family History  Problem Relation Age of Onset  . Cancer Other     lung  . Heart disease Other     cardiovascular    History   Social History  . Marital Status: Widowed    Spouse Name: N/A    Number of Children: N/A  . Years of Education: N/A   Social History Main Topics  . Smoking status: Former Games developer  . Smokeless tobacco: None  . Alcohol Use:   . Drug Use:   . Sexual Activity:    Other Topics Concern  . None   Social History Narrative  . None    Current outpatient prescriptions:conjugated estrogens (PREMARIN) vaginal cream, Place 0.5 Applicatorfuls vaginally daily. 90 day  With 3 refills okay, Disp: 45 g, Rfl: 3;  hydrochlorothiazide (HYDRODIURIL) 25 MG tablet, Take 1 tablet (25 mg total) by mouth daily., Disp: 90 tablet, Rfl: 3;  benzonatate (TESSALON) 100 MG capsule, Take 1 capsule (100 mg total) by mouth 2 (two) times daily as needed for cough., Disp: 20 capsule, Rfl: 0  EXAM:  Filed Vitals:   04/03/13 0846  BP: 120/80  Temp: 97.9 F (36.6 C)    Body mass index is 37.66 kg/(m^2).  GENERAL: vitals reviewed and listed above, alert, oriented, appears well hydrated and in no acute distress  HEENT: atraumatic, conjunttiva clear, no obvious abnormalities on  inspection of external nose and ears, normal appearance of ear canals and TMs, clear nasal congestion, mild post oropharyngeal erythema with PND, no tonsillar edema or exudate, no sinus TTP  NECK: no obvious masses on inspection  LUNGS: clear to auscultation bilaterally, no wheezes, rales or rhonchi, good air movement  CV: HRRR, no peripheral edema  MS: moves all extremities without noticeable abnormality  PSYCH: pleasant and cooperative, no obvious depression or anxiety  ASSESSMENT AND PLAN:  Discussed the following assessment and plan:  Upper respiratory infection - Plan: benzonatate (TESSALON) 100 MG capsule  -given HPI and exam findings today, a serious infection or illness is unlikely. We discussed potential etiologies, with VURI being most likely, and advised supportive care and monitoring. We discussed treatment side effects, likely course, antibiotic misuse, transmission, and signs of developing a serious illness. -of course, we advised to return or notify a doctor immediately if symptoms worsen or persist or new concerns arise.    Patient Instructions  INSTRUCTIONS FOR UPPER RESPIRATORY INFECTION:  -plenty of rest and fluids  -nasal saline wash 2-3 times daily (use prepackaged nasal saline or bottled/distilled water if making your own)   -can use sinex or afrin nasal spray for drainage and nasal congestion - but do NOT use longer then 3-4 days  -can use tylenol or ibuprofen as directed  for aches and sorethroat  -in the winter time, using a humidifier at night is helpful (please follow cleaning instructions)  -if you are taking a cough medication - use only as directed, may also try a teaspoon of honey to coat the throat and throat lozenges  -for sore throat, salt water gargles can help  -follow up if you have fevers, facial pain, tooth pain, difficulty breathing or are worsening or not getting better in 5-7 days      Daltin Crist, Dahlia Client R.

## 2013-04-03 NOTE — Progress Notes (Signed)
Pre visit review using our clinic review tool, if applicable. No additional management support is needed unless otherwise documented below in the visit note. 

## 2013-04-09 ENCOUNTER — Telehealth: Payer: Self-pay

## 2013-04-09 DIAGNOSIS — J069 Acute upper respiratory infection, unspecified: Secondary | ICD-10-CM

## 2013-04-09 MED ORDER — BENZONATATE 100 MG PO CAPS: 100.0000 mg | ORAL_CAPSULE | Freq: Two times a day (BID) | ORAL | Status: DC | PRN

## 2013-04-09 NOTE — Telephone Encounter (Signed)
Received a fax from [facility] for benzonatate 100 mg capsules.  Left a message for pt to return call.

## 2013-04-09 NOTE — Telephone Encounter (Signed)
Called and spoke with pt and pt is aware of Dr. Elmyra RicksKim's recommendations. Rx sent to pharmacy.

## 2013-04-09 NOTE — Telephone Encounter (Signed)
Ok to refill x1 - they are only to be used prn - does not HAVE to use them. Cough usually last 18 days on average with upper resp infection. If worsening or not getting better as expected she should schedule an appointment with her doctor.

## 2013-04-09 NOTE — Telephone Encounter (Signed)
Pt states she still has congestion in her chest and she is still coughing.  Pt still has two pills left and was not sure how much longer she needed the pills.  Pt would like a refill.

## 2013-04-09 NOTE — Telephone Encounter (Signed)
Pt returning your phone call

## 2013-07-07 ENCOUNTER — Other Ambulatory Visit: Payer: Self-pay | Admitting: Dermatology

## 2013-11-14 ENCOUNTER — Other Ambulatory Visit (INDEPENDENT_AMBULATORY_CARE_PROVIDER_SITE_OTHER): Payer: Medicare Other

## 2013-11-14 DIAGNOSIS — Z0000 Encounter for general adult medical examination without abnormal findings: Secondary | ICD-10-CM

## 2013-11-14 DIAGNOSIS — I10 Essential (primary) hypertension: Secondary | ICD-10-CM

## 2013-11-14 LAB — TSH: TSH: 0.41 u[IU]/mL (ref 0.35–4.50)

## 2013-11-14 LAB — BASIC METABOLIC PANEL
BUN: 21 mg/dL (ref 6–23)
CALCIUM: 9 mg/dL (ref 8.4–10.5)
CO2: 26 mEq/L (ref 19–32)
Chloride: 106 mEq/L (ref 96–112)
Creatinine, Ser: 1 mg/dL (ref 0.4–1.2)
GFR: 57.59 mL/min — AB (ref >60.00)
GLUCOSE: 87 mg/dL (ref 70–99)
POTASSIUM: 4.4 meq/L (ref 3.5–5.1)
SODIUM: 138 meq/L (ref 135–145)

## 2013-11-14 LAB — CBC WITH DIFFERENTIAL/PLATELET
BASOS PCT: 1 % (ref 0.0–3.0)
Basophils Absolute: 0.1 10*3/uL (ref 0.0–0.1)
EOS ABS: 0.2 10*3/uL (ref 0.0–0.7)
Eosinophils Relative: 2.9 % (ref 0.0–5.0)
HCT: 35.3 % — ABNORMAL LOW (ref 36.0–46.0)
Hemoglobin: 11.7 g/dL — ABNORMAL LOW (ref 12.0–15.0)
Lymphocytes Relative: 30.6 % (ref 12.0–46.0)
Lymphs Abs: 1.7 10*3/uL (ref 0.7–4.0)
MCHC: 33.2 g/dL (ref 30.0–36.0)
MCV: 94.1 fl (ref 78.0–100.0)
MONO ABS: 0.4 10*3/uL (ref 0.1–1.0)
Monocytes Relative: 6.7 % (ref 3.0–12.0)
NEUTROS PCT: 58.8 % (ref 43.0–77.0)
Neutro Abs: 3.3 10*3/uL (ref 1.4–7.7)
PLATELETS: 227 10*3/uL (ref 150.0–400.0)
RBC: 3.75 Mil/uL — ABNORMAL LOW (ref 3.87–5.11)
RDW: 13.4 % (ref 11.5–15.5)
WBC: 5.7 10*3/uL (ref 4.0–10.5)

## 2013-11-14 LAB — HEPATIC FUNCTION PANEL
ALK PHOS: 51 U/L (ref 39–117)
ALT: 16 U/L (ref 0–35)
AST: 22 U/L (ref 0–37)
Albumin: 3.4 g/dL — ABNORMAL LOW (ref 3.5–5.2)
BILIRUBIN DIRECT: 0.1 mg/dL (ref 0.0–0.3)
BILIRUBIN TOTAL: 0.7 mg/dL (ref 0.2–1.2)
Total Protein: 6.4 g/dL (ref 6.0–8.3)

## 2013-11-14 LAB — POCT URINALYSIS DIPSTICK
BILIRUBIN UA: NEGATIVE
Blood, UA: NEGATIVE
GLUCOSE UA: NEGATIVE
KETONES UA: NEGATIVE
NITRITE UA: NEGATIVE
Spec Grav, UA: 1.015
Urobilinogen, UA: 0.2
pH, UA: 7.5

## 2013-11-14 LAB — LIPID PANEL
Cholesterol: 224 mg/dL — ABNORMAL HIGH (ref 0–200)
HDL: 50.6 mg/dL (ref >39.00)
LDL Cholesterol: 156 mg/dL — ABNORMAL HIGH (ref 0–99)
NONHDL: 173.4
Total CHOL/HDL Ratio: 4
Triglycerides: 87 mg/dL (ref 0.0–149.0)
VLDL: 17.4 mg/dL (ref 0.0–40.0)

## 2013-11-21 ENCOUNTER — Ambulatory Visit (INDEPENDENT_AMBULATORY_CARE_PROVIDER_SITE_OTHER): Payer: Medicare Other | Admitting: Family Medicine

## 2013-11-21 ENCOUNTER — Encounter: Payer: Self-pay | Admitting: Family Medicine

## 2013-11-21 VITALS — BP 110/80 | Temp 97.9°F | Ht 61.0 in | Wt 206.0 lb

## 2013-11-21 DIAGNOSIS — N393 Stress incontinence (female) (male): Secondary | ICD-10-CM

## 2013-11-21 DIAGNOSIS — Z23 Encounter for immunization: Secondary | ICD-10-CM

## 2013-11-21 DIAGNOSIS — E669 Obesity, unspecified: Secondary | ICD-10-CM

## 2013-11-21 DIAGNOSIS — Z0000 Encounter for general adult medical examination without abnormal findings: Secondary | ICD-10-CM

## 2013-11-21 DIAGNOSIS — I10 Essential (primary) hypertension: Secondary | ICD-10-CM

## 2013-11-21 DIAGNOSIS — E0590 Thyrotoxicosis, unspecified without thyrotoxic crisis or storm: Secondary | ICD-10-CM

## 2013-11-21 DIAGNOSIS — N952 Postmenopausal atrophic vaginitis: Secondary | ICD-10-CM

## 2013-11-21 DIAGNOSIS — D649 Anemia, unspecified: Secondary | ICD-10-CM

## 2013-11-21 LAB — FERRITIN: FERRITIN: 63.8 ng/mL (ref 10.0–291.0)

## 2013-11-21 LAB — IRON: IRON: 110 ug/dL (ref 42–145)

## 2013-11-21 LAB — VITAMIN B12: Vitamin B-12: 1105 pg/mL — ABNORMAL HIGH (ref 211–911)

## 2013-11-21 MED ORDER — HYDROCHLOROTHIAZIDE 25 MG PO TABS: 25.0000 mg | ORAL_TABLET | Freq: Every day | ORAL | Status: AC

## 2013-11-21 MED ORDER — ESTROGENS, CONJUGATED 0.625 MG/GM VA CREA: 1.0000 g | TOPICAL_CREAM | Freq: Every day | VAGINAL | Status: DC

## 2013-11-21 NOTE — Progress Notes (Signed)
Pre visit review using our clinic review tool, if applicable. No additional management support is needed unless otherwise documented below in the visit note. 

## 2013-11-21 NOTE — Progress Notes (Signed)
   Subjective:    Patient ID: Kim Fletcher, female    DOB: 1940-12-19, 73 y.o.   MRN: [id number]  HPI  Kim Fletcher  is a 73 year old widowed female nonsmoker who comes in today for evaluation of hypertension  She is on hydrochlorothiazide 25 mg daily BP 110 a radio  He is a hormonal cream 2-3 times weekly for vaginal dryness. Last pelvic and Pap a year ago all normal asymptomatic therefore not repeated  He gets routine eye care,,,,,,,, status post cataract removal,,,,,,, regular dental care, and you mammography, colonoscopy and GI  Vaccinations up-to-date  Cognitive function normal [facility], home health safety reviewed no issues identified, no guns in the house, she does have a health care power of attorney and living well  She is considering moving to [place] to be near her daughter and grandchildren  Review of Systems  Constitutional: Negative.   HENT: Negative.   Eyes: Negative.   Respiratory: Negative.   Cardiovascular: Negative.   Gastrointestinal: Negative.   Genitourinary: Negative.   Musculoskeletal: Negative.   Neurological: Negative.   Psychiatric/Behavioral: Negative.        Objective:   Physical Exam  Constitutional: She appears well-developed and well-nourished.  HENT:  Head: Normocephalic and atraumatic.  Right Ear: External ear normal.  Left Ear: External ear normal.  Nose: Nose normal.  Mouth/Throat: Oropharynx is clear and moist.  Eyes: EOM are normal. Pupils are equal, round, and reactive to light.  Neck: Normal range of motion. Neck supple. No thyromegaly present.  Cardiovascular: Normal rate, regular rhythm, normal heart sounds and intact distal pulses.  Exam reveals no gallop and no friction rub.   No murmur heard. Pulmonary/Chest: Effort normal and breath sounds normal.  Abdominal: Soft. Bowel sounds are normal. She exhibits no distension and no mass. There is no tenderness. There is no rebound.  Genitourinary:  Bilateral  breast exam normal  Musculoskeletal: Normal range of motion.  Lymphadenopathy:    She has no cervical adenopathy.  Neurological: She is alert. She has normal reflexes. No cranial nerve deficit. She exhibits normal muscle tone. Coordination normal.  Skin: Skin is warm and dry.  Total body skin exam normal she has light skin and light eyes a basal cell removed from her chest this past year by the dermatologist.  Psychiatric: She has a normal mood and affect. Her behavior is normal. Judgment and thought content normal.          Assessment & Plan:  Healthy female  Obesity again encouraged diet exercise and weight loss  Hypertension controlled with hydrochlorothiazide 25 mg daily  Postmenopausal vaginal dryness hormonal cream 2-3 times weekly when necessary

## 2013-11-21 NOTE — Patient Instructions (Signed)
Continue the hydrochlorothiazide one daily  Continue the hormonal cream 2-3 times weekly  Labs today because of your low blood count  Followup in one week  Avoid aspirin and all aspirin products

## 2013-11-22 ENCOUNTER — Telehealth: Payer: Self-pay | Admitting: Family Medicine

## 2013-11-22 NOTE — Telephone Encounter (Signed)
Relevant patient education assigned to patient using Emmi. ° °

## 2013-11-28 ENCOUNTER — Other Ambulatory Visit: Payer: Self-pay | Admitting: Family Medicine

## 2013-11-28 ENCOUNTER — Ambulatory Visit (INDEPENDENT_AMBULATORY_CARE_PROVIDER_SITE_OTHER): Payer: Medicare Other | Admitting: Family Medicine

## 2013-11-28 ENCOUNTER — Encounter: Payer: Self-pay | Admitting: Family Medicine

## 2013-11-28 VITALS — Temp 97.6°F | Wt 202.0 lb

## 2013-11-28 DIAGNOSIS — D649 Anemia, unspecified: Secondary | ICD-10-CM

## 2013-11-28 NOTE — Patient Instructions (Signed)
Avoid all NSAIDs and aspirin products  Tylenol only  Iron........... one tablet nightly at bedtime  Return in 6 weeks for followup.

## 2013-11-28 NOTE — Progress Notes (Signed)
   Subjective:    Patient ID: Kim Fletcher, female    DOB: Oct 27, 1940, 73 y.o.   MRN: [id number]  HPI Kim Fletcher is a 73 year old female who comes in today for followup of a low hemoglobin  We did her physical a week or so ago her hemoglobin dropped to 11.7. She was taken Aleve twice a day for 6 months and an occasional aspirin pill. B12 iron levels normal  She's had frequent colonoscopies because of a history of colon polyps. She's due for followup colonoscopy with Dr. Dickie La.  Review of systems otherwise negative no fever chills weight loss rectal bleeding etc. etc.   Review of Systems    review of systems negative Objective:   Physical Exam  Well-developed and nourished female no acute distress vital signs stable she is afebrile  We discussed risks options. Since she been on Aleve twice a day for 6 months it may been gastric irritation that caused her blood count dropped. However her arm level and B12 levels are normal. We discussed trying to give her supplemental iron for 6 weeks or hematology consult. She was to try the iron first which I think is reasonable since she is otherwise well. But I do want her to get her colonoscopy      Assessment & Plan:  Anemia question NSAID-induced,,,,,,,,,,,,, stay away from all NSAIDs and aspirin,,,,,,,,, iron one a bedtime followup in 6 weeks

## 2013-12-17 ENCOUNTER — Telehealth: Payer: Self-pay | Admitting: Family Medicine

## 2013-12-17 DIAGNOSIS — N393 Stress incontinence (female) (male): Secondary | ICD-10-CM

## 2013-12-17 DIAGNOSIS — N952 Postmenopausal atrophic vaginitis: Secondary | ICD-10-CM

## 2013-12-17 MED ORDER — ESTROGENS, CONJUGATED 0.625 MG/GM VA CREA: 1.0000 g | TOPICAL_CREAM | Freq: Every day | VAGINAL | Status: AC

## 2013-12-17 NOTE — Telephone Encounter (Signed)
[facility] - [place], [place] - [address] is requesting 90 day re-fill on conjugated estrogens (PREMARIN) vaginal cream

## 2014-01-09 ENCOUNTER — Encounter: Payer: Self-pay | Admitting: Family Medicine

## 2014-01-09 ENCOUNTER — Ambulatory Visit (INDEPENDENT_AMBULATORY_CARE_PROVIDER_SITE_OTHER): Payer: Medicare Other | Admitting: Family Medicine

## 2014-01-09 VITALS — Temp 97.8°F | Wt 202.0 lb

## 2014-01-09 DIAGNOSIS — D649 Anemia, unspecified: Secondary | ICD-10-CM

## 2014-01-09 DIAGNOSIS — D509 Iron deficiency anemia, unspecified: Secondary | ICD-10-CM

## 2014-01-09 LAB — POCT HEMOGLOBIN: HEMOGLOBIN: 12 g/dL — AB (ref 12.2–16.2)

## 2014-01-09 NOTE — Progress Notes (Signed)
Pre visit review using our clinic review tool, if applicable. No additional management support is needed unless otherwise documented below in the visit note. 

## 2014-01-09 NOTE — Patient Instructions (Signed)
Take the iron one daily at bedtime for 2 more months  Avoid the NSAIDs  Return when necessary

## 2014-01-09 NOTE — Progress Notes (Signed)
   Subjective:    Patient ID: Tenny CrawMary C Shehata, female    DOB: [birth year], 73 y.o.   MRN: [id number]  HPI [name] is a 73 year old female who comes in today for followup of anemia  We saw her her hemoglobin was 11.5 and she been taking over-the-counter NSAIDs. Workup was negative no blood in her bowel movements. Her serum iron level and B12 level fairly normal. We therefore empirically stopped her anti-inflammatories begin iron and she's today for followup. Hemoglobin dropped to 12   Review of Systems    review of systems negative Objective:   Physical Exam  Well-developed well-nourished female no acute distress vital signs stable she's afebrile BP today 110/68  Hemoglobin up to 12      Assessment & Plan:  Anemia probably secondary to gastric irritation from NSAID......... iron x2 more months... avoid NSAIDs in the future ...........Marland Kitchen

## 2014-05-21 ENCOUNTER — Other Ambulatory Visit: Payer: Self-pay | Admitting: Dermatology

## 2014-09-15 ENCOUNTER — Telehealth: Payer: Self-pay

## 2014-09-15 NOTE — Telephone Encounter (Signed)
Left detailed message concerning setting up mammogram.

## 2014-10-02 ENCOUNTER — Encounter: Payer: Self-pay | Admitting: Internal Medicine

## 2016-12-23 ENCOUNTER — Encounter: Payer: Self-pay | Admitting: Family Medicine

## 2022-02-02 DEATH — deceased
